# Patient Record
Sex: Female | Born: 1965 | Race: White | Hispanic: No | Marital: Married | Smoking: Never smoker
Health system: Southern US, Community
[De-identification: ages and names within clinical notes are randomized; demographics above are authoritative.]

## PROBLEM LIST (undated history)

## (undated) DIAGNOSIS — Z923 Personal history of irradiation: Secondary | ICD-10-CM

## (undated) DIAGNOSIS — R519 Headache, unspecified: Secondary | ICD-10-CM

## (undated) DIAGNOSIS — C50919 Malignant neoplasm of unspecified site of unspecified female breast: Secondary | ICD-10-CM

## (undated) HISTORY — DX: Headache, unspecified: R51.9

## (undated) HISTORY — DX: Malignant neoplasm of unspecified site of unspecified female breast: C50.919

## (undated) HISTORY — PX: LOBECTOMY: SHX5089

---

## 1997-06-02 ENCOUNTER — Other Ambulatory Visit: Admission: RE | Admit: 1997-06-02 | Discharge: 1997-06-02 | Payer: Self-pay | Admitting: *Deleted

## 1997-07-08 ENCOUNTER — Other Ambulatory Visit: Admission: RE | Admit: 1997-07-08 | Discharge: 1997-07-08 | Payer: Self-pay | Admitting: Obstetrics and Gynecology

## 1997-09-26 ENCOUNTER — Inpatient Hospital Stay (HOSPITAL_COMMUNITY): Admission: AD | Admit: 1997-09-26 | Discharge: 1997-09-29 | Payer: Self-pay | Admitting: Obstetrics and Gynecology

## 1997-10-29 ENCOUNTER — Other Ambulatory Visit: Admission: RE | Admit: 1997-10-29 | Discharge: 1997-10-29 | Payer: Self-pay | Admitting: Obstetrics and Gynecology

## 1998-12-17 ENCOUNTER — Other Ambulatory Visit: Admission: RE | Admit: 1998-12-17 | Discharge: 1998-12-17 | Payer: Self-pay | Admitting: Obstetrics and Gynecology

## 1999-09-02 ENCOUNTER — Other Ambulatory Visit: Admission: RE | Admit: 1999-09-02 | Discharge: 1999-09-02 | Payer: Self-pay | Admitting: Obstetrics and Gynecology

## 2000-03-26 ENCOUNTER — Inpatient Hospital Stay (HOSPITAL_COMMUNITY): Admission: AD | Admit: 2000-03-26 | Discharge: 2000-03-29 | Payer: Self-pay | Admitting: Obstetrics and Gynecology

## 2000-03-30 ENCOUNTER — Encounter: Admission: RE | Admit: 2000-03-30 | Discharge: 2000-04-29 | Payer: Self-pay | Admitting: Obstetrics and Gynecology

## 2000-05-01 ENCOUNTER — Other Ambulatory Visit: Admission: RE | Admit: 2000-05-01 | Discharge: 2000-05-01 | Payer: Self-pay | Admitting: Obstetrics and Gynecology

## 2000-05-30 ENCOUNTER — Encounter: Admission: RE | Admit: 2000-05-30 | Discharge: 2000-06-11 | Payer: Self-pay | Admitting: Obstetrics and Gynecology

## 2001-06-24 ENCOUNTER — Other Ambulatory Visit: Admission: RE | Admit: 2001-06-24 | Discharge: 2001-06-24 | Payer: Self-pay | Admitting: Obstetrics and Gynecology

## 2002-08-08 ENCOUNTER — Other Ambulatory Visit: Admission: RE | Admit: 2002-08-08 | Discharge: 2002-08-08 | Payer: Self-pay | Admitting: Obstetrics and Gynecology

## 2004-03-28 ENCOUNTER — Encounter: Admission: RE | Admit: 2004-03-28 | Discharge: 2004-03-28 | Payer: Self-pay | Admitting: Internal Medicine

## 2005-10-26 ENCOUNTER — Encounter: Admission: RE | Admit: 2005-10-26 | Discharge: 2005-10-26 | Payer: Self-pay | Admitting: Obstetrics and Gynecology

## 2006-11-01 ENCOUNTER — Encounter: Admission: RE | Admit: 2006-11-01 | Discharge: 2006-11-01 | Payer: Self-pay | Admitting: Obstetrics and Gynecology

## 2007-11-04 ENCOUNTER — Encounter: Admission: RE | Admit: 2007-11-04 | Discharge: 2007-11-04 | Payer: Self-pay | Admitting: Obstetrics and Gynecology

## 2008-11-11 ENCOUNTER — Encounter: Admission: RE | Admit: 2008-11-11 | Discharge: 2008-11-11 | Payer: Self-pay | Admitting: Obstetrics and Gynecology

## 2009-11-12 ENCOUNTER — Encounter: Admission: RE | Admit: 2009-11-12 | Discharge: 2009-11-12 | Payer: Self-pay | Admitting: Obstetrics and Gynecology

## 2009-11-25 ENCOUNTER — Encounter: Admission: RE | Admit: 2009-11-25 | Discharge: 2009-11-25 | Payer: Self-pay | Admitting: Obstetrics and Gynecology

## 2010-01-29 ENCOUNTER — Encounter: Payer: Self-pay | Admitting: Obstetrics and Gynecology

## 2010-04-26 ENCOUNTER — Other Ambulatory Visit: Payer: Self-pay | Admitting: Obstetrics and Gynecology

## 2010-04-26 DIAGNOSIS — Z09 Encounter for follow-up examination after completed treatment for conditions other than malignant neoplasm: Secondary | ICD-10-CM

## 2010-05-13 ENCOUNTER — Ambulatory Visit
Admission: RE | Admit: 2010-05-13 | Discharge: 2010-05-13 | Disposition: A | Discharge status: Home / Self Care | Payer: BC Managed Care – PPO | Source: Ambulatory Visit | Attending: Obstetrics and Gynecology | Admitting: Obstetrics and Gynecology

## 2010-05-13 ENCOUNTER — Other Ambulatory Visit: Payer: Self-pay | Admitting: Obstetrics and Gynecology

## 2010-05-13 ENCOUNTER — Other Ambulatory Visit: Payer: Self-pay | Admitting: Diagnostic Radiology

## 2010-05-13 DIAGNOSIS — Z09 Encounter for follow-up examination after completed treatment for conditions other than malignant neoplasm: Secondary | ICD-10-CM

## 2010-05-16 ENCOUNTER — Other Ambulatory Visit: Payer: Self-pay | Admitting: Obstetrics and Gynecology

## 2010-05-16 DIAGNOSIS — C50911 Malignant neoplasm of unspecified site of right female breast: Secondary | ICD-10-CM

## 2010-05-19 ENCOUNTER — Ambulatory Visit
Admission: RE | Admit: 2010-05-19 | Discharge: 2010-05-19 | Disposition: A | Discharge status: Home / Self Care | Payer: BC Managed Care – PPO | Source: Ambulatory Visit | Attending: Obstetrics and Gynecology | Admitting: Obstetrics and Gynecology

## 2010-05-19 ENCOUNTER — Other Ambulatory Visit: Payer: Self-pay | Admitting: Obstetrics and Gynecology

## 2010-05-19 DIAGNOSIS — C50911 Malignant neoplasm of unspecified site of right female breast: Secondary | ICD-10-CM

## 2010-05-19 MED ORDER — GADOBENATE DIMEGLUMINE 529 MG/ML IV SOLN
12.0000 mL | Freq: Once | INTRAVENOUS | Status: AC | PRN
  Administered 2010-05-19: 12 mL via INTRAVENOUS

## 2010-05-20 ENCOUNTER — Ambulatory Visit
Admission: RE | Admit: 2010-05-20 | Discharge: 2010-05-20 | Disposition: A | Discharge status: Home / Self Care | Payer: BC Managed Care – PPO | Source: Ambulatory Visit | Attending: Obstetrics and Gynecology | Admitting: Obstetrics and Gynecology

## 2010-05-20 ENCOUNTER — Other Ambulatory Visit: Payer: Self-pay | Admitting: Diagnostic Radiology

## 2010-05-20 ENCOUNTER — Other Ambulatory Visit: Payer: Self-pay | Admitting: Obstetrics and Gynecology

## 2010-05-20 DIAGNOSIS — C50911 Malignant neoplasm of unspecified site of right female breast: Secondary | ICD-10-CM

## 2010-05-21 ENCOUNTER — Other Ambulatory Visit: Payer: BC Managed Care – PPO

## 2010-05-27 NOTE — H&P (Signed)
[facility]  Patient:    [name] [name], [name] [name]                  MRN: [id number] Adm. Date:  03/26/00 Attending:  Nena Jordan A. Cherly Hensen, M.D. Dictator:   YSheronette A. Cherly Hensen, M.D.                         History and Physical  CHIEF COMPLAINT:              Previous cesarean section, scheduled repeat C section.  HISTORY OF PRESENT ILLNESS:   This is a 45 year old gravida 2 para 1-0-0-1 married white female, last menstrual period of June 13, 1999, [facility] of March 30, 2000, with a prior cesarean section secondary for nonreassuring fetal status, who is now at term being admitted for an elective repeat cesarean section. The patient declined attempted vaginal delivery.  Her prenatal course has been unremarkable.  Prenatal care is at [facility]; primary obstetrician Maxie Better, M.D.  Blood type is O negative, antibody screen negative, RhoGAM was given on January 13, 2000.  RPR nonreactive.  Rubella immune. Hepatitis B surface antigen negative.  HIV testing was nonreactive.  Pap was normal.  GC and chlamydia cultures were negative.  Group B strep culture is positive on March 08, 2000.  Anatomic fetal survey was performed on November 15, 1999 and was normal.  One-hour GCT was normal.  PAST MEDICAL HISTORY:  ALLERGIES:                    No known drug allergies.  MEDICINES:                    Prenatal vitamins.  MEDICAL HISTORY:              Negative.  SURGICAL HISTORY:             Cesarean section September 26, 1997. Cryosurgery in 1993.  OBSTETRICAL HISTORY:          Emergent cesarean section September 26, 1997 for nonreassuring fetal pattern.  Patient was term, 6 pound 11 ounce baby.  FAMILY HISTORY:               Father - thyroid dysfunction.  Maternal grandmother died of stroke.  No genital, colon, or ovarian cancer.  SOCIAL HISTORY:               Married, nonsmoker, one child.  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  REVIEW OF SYSTEMS:            Negative.  PHYSICAL EXAMINATION:  GENERAL:                      Well-developed, well-nourished gravid white female in no acute distress.  VITAL SIGNS:                  Blood pressure 106/70, weight 172.2. pounds. Fetal heart rate 150s.  SKIN:                         Shows no lesions.  HEENT:                        Anicteric sclerae, pink conjunctivae. Oropharynx negative.  HEART:  Regular rate and rhythm without murmur.  LUNGS:                        Clear to auscultation.  BREASTS:                      Soft, nontender, no palpable mass.  ABDOMEN:                      Gravid, fundal height 37 cm.  Fetal heart rate 150.  A low transverse well-healed incision noted.  PELVIC:                       Vulva show no lesion, no palpable mass. Bimanual examination reveals cervix is closed, long, posterior, -2, vertex presentation.  EXTREMITIES:                  Trace edema.  IMPRESSION:                   Term gestation, previous cesarean section, for elective repeat.  Rh negative, group B strep positive.  PLAN:                         Admission, repeat cesarean section.  Routine preoperative labs.  RhoGAM postpartum if indicated.  Risks of the procedure were reviewed, including but not limited to, infection, bleeding, injury to surrounding organ structure, repeat cesarean section in the future, internal scar tissue, indication and risk for blood transfusion.  Postoperative care was also reviewed.  All questions answered.DD:  03/22/00 TD:  03/22/00 Job: [id number]

## 2010-05-27 NOTE — Discharge Summary (Signed)
[facility]  Patient:    [name] [name], [name] [name]                MRN: [id number] Adm. Date:  [id number] Disc. Date: [id number] Attending:  Maxie Better                           Discharge Summary  ADMISSION DIAGNOSES:          Term gestation, previous cesarean section.  DISCHARGE DIAGNOSES:          Term gestation delivered, repeat cesarean section, gestational thrombocytopenia.  PROCEDURE:                    Repeat cesarean section.  HISTORY OF PRESENT ILLNESS:   This is a 45 year old gravida 2, para 1-0-0-1 female at term with a previous cesarean section who is being admitted for a repeat cesarean section.  Her prenatal course was unremarkable.  Blood type is O-.  Rubella is immune.  Group B strep culture is positive.  HOSPITAL COURSE:              The patient was admitted.  She was taken to the operating room where she underwent a repeat cesarean section with subsequent delivery of a live female infant who weighed 8 pounds 4 ounces, Apgars of 8 and 9, cord around the neck x 2.  Normal tubes and ovaries were identified.  Her postoperative course was notable for transient urinary retention.  Her CBC on postoperative day #1 showed a platelet count of 112,000 (her admission platelet count was 146,000), hematocrit 30.9, hemoglobin 10.4.  The babys blood type was subsequently Rh negative, therefore the patient did not receive RhoGAM.  By postoperative day #3 the patient was tolerating a regular diet, had remained afebrile throughout her course and was deemed well to be discharged home.  DISPOSITION:                  Home.  CONDITION ON DISCHARGE:       Stable.  DISCHARGE MEDICATIONS:        1. Tylox #30 one p.o. q.4h. p.r.n. pain.                               2. Prenatal vitamins one p.o. q.d.                               3. Motrin 800 mg one q.6h. p.r.n. pain.  FOLLOW-UP:                    Four weeks postpartum at [facility] at which  time a platelet count will be rechecked. DD:  05/03/00 TD:  05/04/00 Job: [id number]

## 2010-05-27 NOTE — Op Note (Signed)
[facility]  Patient:    [name] [name], [name] [name]                   MRN: [id number] Proc. Date: 03/26/00 Attending:  Nena Jordan A. Cherly Hensen, M.D.                           Operative Report  PREOPERATIVE DIAGNOSES:       1. Term gestation.                               2. Previous cesarean section.  POSTOPERATIVE DIAGNOSIS:      1. Previous cesarean section.                               2. Term gestation.  PROCEDURE:                    Repeat cesarean section per hysterotomy.  SURGEON:                      Sheronette A. Cherly Hensen, M.D.  ASSISTANT:                    Lenoard Aden, M.D.  ANESTHESIA:                   Spinal.  INDICATIONS:                  This is as 45 year old gravida 2, para 1 female at term with a previous cesarean section who now desires an elective repeat cesarean section.  The risks and benefits of the procedure have been ex[plained to the patient and consent was signed.  The patient was transferred to the operating room.  DESCRIPTION OF PROCEDURE:     Under adequate spinal anesthesia, the patient was placed in the supine position with a left lateral tilt.  The abdomen was sterilely prepped and draped in the usual fashion.  An indwelling Foley catheter had been placed sterilely.  Then 0.25% Marcaine was injected along the previous skin incision.  The Pfannenstiel skin incision was then made and carried down to the rectus fascia using Bovie cautery.  The rectus fascia was incised in the midline and extended bilaterally.  The rectus fascia was then bluntly and with sharp dissection dissected off the rectus muscles in a superior and inferior fashion.  During dissection of the superior aspect, the parietal was entered, but no underlying entry was noted.  The rectus muscle was split in the midline.  The parietal peritoneum was further opened and extended.  The vesicouterine peritoneum incision was then created.  The bladder was  then bluntly dissected off the lower uterine segment and displaced from the operative field using a Doyen retractor.  A curvilinear transverse uterine incision was then made and extended bilaterally using bandage scissors.  The amniotic bag was then noted, which was artificially ruptured with a copious amount of clear amniotic fluid.  Subsequent delivery of a live female infant from the right occiput transverse position was then accomplished. a cord around the neck x 2 was noted and reduced easily.  The baby was bulb suctioned on the abdomen.  The cord was then clamped and cut.  The baby was transferred to the awaiting pediatricians, who subsequently assigned Apgars of 8 and 9  at one and five minutes.  The weight of the baby was 8 lb 4 oz.  The placenta was posterior, spontaneous and intact.  The uterus was exteriorized. The uterine cavity was cleaned of debris.  Normal tubes and ovaries were noted bilaterally.  The uterine incision was closed in two layers.  The first layer was a running lock stitch of 0 Monocryl.  The second layer was imbricated using 0 Monocryl suture.  Inspection of the tubes and ovaries was notable for the left mesovarium tissue containing a defect, which was then closed using 3-0 Vicryl sutures x 2.  The uterus was then returned to the abdomen.  The abdomen was copiously irrigated and suctioned of debris.  Reinspection of the incision site as well as the ovaries showed good hemostasis.  The rectus fascia was inspected.  The underlying surface of the superior aspect of the rectus fascia had a small, button-sized defect which was closed with 0 Vicryl figure-of-eight suture.  The rectus muscle was inspected.  No bleeders were noted.  The rectus fascia was closed with a 0 Vicryl x 2.  The subcutaneous area was irrigated.  Small bleeders were cauterized.  The skin was approximated using Ethicon staples.  SPECIMEN:                     Placenta sent to pathology.  ESTIMATED  BLOOD LOSS:         750 cc.  COMPLICATIONS:                None.  INTRAOPERATIVE FLUID:         2100 cc of crystalloid.  URINE OUTPUT:                 250 cc clear yellow urine.  COUNTS:                       Sponge and instrument counts x 2 were correct.  DISPOSITION:                  The patient tolerated the procedure well and was transferred to the recovery room in stable condition. DD:  03/26/00 TD:  03/27/00 Job: [id number]

## 2010-05-30 ENCOUNTER — Encounter (HOSPITAL_BASED_OUTPATIENT_CLINIC_OR_DEPARTMENT_OTHER): Payer: BC Managed Care – PPO | Admitting: Genetic Counselor

## 2010-06-13 ENCOUNTER — Other Ambulatory Visit (INDEPENDENT_AMBULATORY_CARE_PROVIDER_SITE_OTHER): Payer: Self-pay | Admitting: General Surgery

## 2010-06-13 DIAGNOSIS — C50911 Malignant neoplasm of unspecified site of right female breast: Secondary | ICD-10-CM

## 2010-06-14 HISTORY — PX: BREAST LUMPECTOMY: SHX2

## 2010-06-17 ENCOUNTER — Encounter (HOSPITAL_COMMUNITY)
Admission: RE | Admit: 2010-06-17 | Discharge: 2010-06-17 | Disposition: A | Discharge status: Home / Self Care | Payer: BC Managed Care – PPO | Source: Ambulatory Visit | Attending: General Surgery | Admitting: General Surgery

## 2010-06-17 LAB — CANCER ANTIGEN 27.29: CA 27.29: 22 U/mL (ref 0–39)

## 2010-06-17 LAB — DIFFERENTIAL
Lymphocytes Relative: 25 % (ref 12–46)
Lymphs Abs: 1.7 10*3/uL (ref 0.7–4.0)
Monocytes Relative: 5 % (ref 3–12)
Neutro Abs: 4.7 10*3/uL (ref 1.7–7.7)
Neutrophils Relative %: 68 % (ref 43–77)

## 2010-06-17 LAB — CBC
HCT: 40.2 % (ref 36.0–46.0)
Hemoglobin: 13.9 g/dL (ref 12.0–15.0)
MCH: 30.4 pg (ref 26.0–34.0)
MCV: 88 fL (ref 78.0–100.0)
RBC: 4.57 MIL/uL (ref 3.87–5.11)
WBC: 6.9 10*3/uL (ref 4.0–10.5)

## 2010-06-17 LAB — BASIC METABOLIC PANEL
BUN: 12 mg/dL (ref 6–23)
CO2: 28 mEq/L (ref 19–32)
Chloride: 99 mEq/L (ref 96–112)
Creatinine, Ser: 0.53 mg/dL (ref 0.4–1.2)
GFR calc Af Amer: >60 mL/min (ref >60)
Potassium: 4.3 mEq/L (ref 3.5–5.1)

## 2010-06-24 ENCOUNTER — Ambulatory Visit
Admission: RE | Admit: 2010-06-24 | Discharge: 2010-06-24 | Disposition: A | Discharge status: Home / Self Care | Payer: BC Managed Care – PPO | Source: Ambulatory Visit | Attending: General Surgery | Admitting: General Surgery

## 2010-06-24 ENCOUNTER — Ambulatory Visit (HOSPITAL_COMMUNITY)
Admission: RE | Admit: 2010-06-24 | Discharge: 2010-06-24 | Disposition: A | Discharge status: Home / Self Care | Payer: BC Managed Care – PPO | Source: Ambulatory Visit | Attending: General Surgery | Admitting: General Surgery

## 2010-06-24 ENCOUNTER — Other Ambulatory Visit (INDEPENDENT_AMBULATORY_CARE_PROVIDER_SITE_OTHER): Payer: Self-pay | Admitting: General Surgery

## 2010-06-24 DIAGNOSIS — C50911 Malignant neoplasm of unspecified site of right female breast: Secondary | ICD-10-CM

## 2010-06-24 DIAGNOSIS — D0590 Unspecified type of carcinoma in situ of unspecified breast: Secondary | ICD-10-CM | POA: Insufficient documentation

## 2010-06-24 DIAGNOSIS — Z01812 Encounter for preprocedural laboratory examination: Secondary | ICD-10-CM | POA: Insufficient documentation

## 2010-06-27 NOTE — Op Note (Signed)
[name], [name]         ACCOUNT NO.:  [id number]  MEDICAL RECORD NO.:  [id number]  LOCATION:  [facility]                         FACILITY:  [facility]  PHYSICIAN:  Juanetta Gosling, MDDATE OF BIRTH:  1965/02/27  DATE OF PROCEDURE:  06/24/2010 DATE OF DISCHARGE:  06/24/2010                              OPERATIVE REPORT   PREOPERATIVE DIAGNOSIS:  Stage 0 right breast cancer.  POSTOPERATIVE DIAGNOSIS:  Stage 0 right breast cancer.  PROCEDURE:  Right breast wire guided lumpectomy.  SURGEON:  Juanetta Gosling, MD  ASSISTANT:  None.  ANESTHESIA:  General.  SUPERVISING ANESTHESIOLOGIST:  Dr. Adonis Huguenin.  SPECIMENS:  Right breast tissue marked with paint kit to pathology.  ESTIMATED BLOOD LOSS:  Minimal.  COMPLICATIONS:  None.  DRAINS:  None.  DISPOSITION:  To recovery room in stable condition.  INDICATIONS:  This is a 45 year old healthy female who was undergoing a short interval follow-up for some right breast microcalcifications in early May where she had a cluster of slightly pleomorphic calcifications in the right upper inner quadrant.  She underwent a stereotactic core biopsy with tissue marker clip placement showing DCIS with calcifications and ALH.  ER and PR was both positive.  She also underwent diagnostic left mammogram showing some suspicious calcifications that were biopsied and were negative and now recommended for any further evaluation at this time.  MRI showed postbiopsy changes in the medial right breast associated with marker artifact, but no other abnormalities.  We discussed all of her different options in that she had negative genetic testing we decided after all discussing all these options to proceed with breast conservation therapy.  We are not going to pursue a lymph node biopsy at this time given the characteristics of this size and as well as the abnormality of any other findings.  PROCEDURE:  After informed consent was obtained, the  patient was taken to the operating room.  She had first had a wire placed by Dr. Deboraha Sprang and I had the mammograms available for my review in the operating room.  She was administered 1 g of intravenous cefazolin.  Sequential compression devices were placed on lower extremities prior to induction with anesthesia.  She was then placed under general anesthesia with an LMA without complication.  Her right breast was prepped and draped in standard sterile surgical fashion.  A surgical time-out was then performed.  I then injected Exparel all around the site of the surgery.  I then made a radial incision overlying the wire and tracked towards the areolar border.  Cautery was then used to remove the wire as well as the lesion and the surrounding tissue.  I did not take this all the way down to the pectoralis fascia given the amount of breast tissue that had to be removed to do that.  I took a wide margin around the wire.  Hemostasis was observed.  I then marked this with a paint kit.  I then used the Flaxitron mammogram, the clip and the lesions appeared to be in the center of the area that I had removed.  This was then sent off to pathology.  This was also confirmed by Dr. Deboraha Sprang.  I placed two clips deep and one  clip in each cardinal position in the cavity.  I then closed the deep breast tissue with 2-0 Vicryl, the dermis with 3-0 Monocryl, and the Dermabond and Steri-Strips were placed over this.  An ABD and a breast binder were then placed.  She tolerated this well, was extubated in the operating room, and transferred to recovery room in stable condition.     Juanetta Gosling, MD     MCW/MEDQ  D:  06/24/2010  T:  06/25/2010  Job:  [id number]  cc:   Luanna Cole. Lenord Fellers, M.D. Daryl Eastern, M.D.  Electronically Signed by Emelia Loron MD on 06/27/2010 04:21:56 PM

## 2010-06-29 ENCOUNTER — Encounter (HOSPITAL_BASED_OUTPATIENT_CLINIC_OR_DEPARTMENT_OTHER): Payer: BC Managed Care – PPO | Admitting: Oncology

## 2010-06-29 ENCOUNTER — Encounter (INDEPENDENT_AMBULATORY_CARE_PROVIDER_SITE_OTHER): Payer: Self-pay | Admitting: General Surgery

## 2010-06-29 ENCOUNTER — Other Ambulatory Visit: Payer: Self-pay | Admitting: Oncology

## 2010-06-29 ENCOUNTER — Encounter: Payer: BC Managed Care – PPO | Admitting: Oncology

## 2010-06-29 DIAGNOSIS — D0590 Unspecified type of carcinoma in situ of unspecified breast: Secondary | ICD-10-CM

## 2010-06-29 LAB — CBC WITH DIFFERENTIAL/PLATELET
Basophils Absolute: 0 10*3/uL (ref 0.0–0.1)
Eosinophils Absolute: 0.1 10*3/uL (ref 0.0–0.5)
HCT: 38.3 % (ref 34.8–46.6)
HGB: 13 g/dL (ref 11.6–15.9)
NEUT#: 3.3 10*3/uL (ref 1.5–6.5)
NEUT%: 61.4 % (ref 38.4–76.8)
RDW: 13 % (ref 11.2–14.5)
lymph#: 1.6 10*3/uL (ref 0.9–3.3)

## 2010-06-29 LAB — COMPREHENSIVE METABOLIC PANEL
Albumin: 4 g/dL (ref 3.5–5.2)
BUN: 13 mg/dL (ref 6–23)
CO2: 27 mEq/L (ref 19–32)
Calcium: 9.5 mg/dL (ref 8.4–10.5)
Chloride: 101 mEq/L (ref 96–112)
Creatinine, Ser: 0.58 mg/dL (ref 0.50–1.10)
Glucose, Bld: 87 mg/dL (ref 70–99)
Potassium: 3.7 mEq/L (ref 3.5–5.3)

## 2010-06-30 ENCOUNTER — Ambulatory Visit
Admission: RE | Admit: 2010-06-30 | Discharge: 2010-06-30 | Disposition: A | Discharge status: Home / Self Care | Payer: BC Managed Care – PPO | Source: Ambulatory Visit | Attending: Radiation Oncology | Admitting: Radiation Oncology

## 2010-06-30 DIAGNOSIS — Z510 Encounter for antineoplastic radiation therapy: Secondary | ICD-10-CM | POA: Insufficient documentation

## 2010-06-30 DIAGNOSIS — D0590 Unspecified type of carcinoma in situ of unspecified breast: Secondary | ICD-10-CM | POA: Insufficient documentation

## 2010-07-12 ENCOUNTER — Encounter (INDEPENDENT_AMBULATORY_CARE_PROVIDER_SITE_OTHER): Payer: BC Managed Care – PPO | Admitting: General Surgery

## 2010-07-18 ENCOUNTER — Encounter (INDEPENDENT_AMBULATORY_CARE_PROVIDER_SITE_OTHER): Payer: Self-pay | Admitting: General Surgery

## 2010-07-18 ENCOUNTER — Ambulatory Visit (INDEPENDENT_AMBULATORY_CARE_PROVIDER_SITE_OTHER): Payer: BC Managed Care – PPO | Admitting: General Surgery

## 2010-07-18 DIAGNOSIS — Z09 Encounter for follow-up examination after completed treatment for conditions other than malignant neoplasm: Secondary | ICD-10-CM

## 2010-07-18 DIAGNOSIS — C50811 Malignant neoplasm of overlapping sites of right female breast: Secondary | ICD-10-CM | POA: Insufficient documentation

## 2010-07-18 DIAGNOSIS — D0590 Unspecified type of carcinoma in situ of unspecified breast: Secondary | ICD-10-CM

## 2010-07-18 NOTE — Progress Notes (Signed)
Subjective:     Patient ID: [name] [name], female   DOB: 1965-11-01, 45 y.o.   MRN: [id number]    LMP 05/10/2010    HPI This is a 45 year old female who had a newly diagnosed right breast mass. She underwent a right breast wire-guided lumpectomy on June 15. 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 All of her margins are clear. The closest margin is her superior anterior margin which is 2 mm. This is ER positive at 98%. This is PR positive at 98%. She has done fairly well after this. She had a little bit of drainage from her incision. For which she was seen by one of my partners. She also had a little bit of drainage when she was at the beach recently was placed on Levaquin. While she was at the beach he also a fairly significant episode where she had some skin issues with blistering on her arms legs and on her face. This is all getting better and predated her antibiotics. She comes in today doing fairly well without any significant complaints. She's been seen by Dr. Lurline Hare and is due to get stimulated tomorrow to begin radiation therapy on Monday. She's also been seen by Dr. Marikay Alar Magrinat and  has a prescription for tamoxifen which she'll begin.  Review of Systems     Objective:   Physical Exam Right breast incision healing without infection, small area of skin separation, there is redness clearly where she has had a tape dressing in place    Assessment:      Stage 0 right breast cancer Postop lumpectomy    Plan:         I discussed with the patient and her husband today think this area she does continue to improve. I told him to bleeding or tape on the incision. I told her she could begin massaging her incision with cocoa butter. She can also put Neosporin over it. I think he should be fine to begin radiation next week I will lead Dr. Michell Heinrich no this. We discussed followup over the long-term. She is due to begin  tamoxifen after she completes radiation. I plan on seeing her back about a month or so after she completes radiation just to make sure everything is healed the she's doing well. Following that I would be happy to switch visits with Dr. 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

## 2010-09-27 ENCOUNTER — Ambulatory Visit (INDEPENDENT_AMBULATORY_CARE_PROVIDER_SITE_OTHER): Payer: BC Managed Care – PPO | Admitting: General Surgery

## 2010-09-27 ENCOUNTER — Encounter (INDEPENDENT_AMBULATORY_CARE_PROVIDER_SITE_OTHER): Payer: Self-pay | Admitting: General Surgery

## 2010-09-27 VITALS — BP 102/74 | HR 68 | Temp 97.6°F | Resp 14 | Ht 64.0 in | Wt 139.6 lb

## 2010-09-27 DIAGNOSIS — D0590 Unspecified type of carcinoma in situ of unspecified breast: Secondary | ICD-10-CM

## 2010-09-27 NOTE — Patient Instructions (Signed)
Breast Problems and Self Exam Completing monthly breast exams may pick up problems early and save lives. There can be numerous causes of swelling, tenderness or lumps in the breasts. Some of these causes are:   Fibrocystic breast syndrome (noncancerous lumps). This is the most common cause of lumps in the breast.   Fibroadenoma breast tumors of unknown cause. These are noncancerous (benign) lumps.   Benign fatty tumors (lipomas).   Cancer of the breast.  By doing monthly breast exams, you get to know how your breasts feel and how they can change from month to month. This allows you to notice changes early. It can also offer you some reassurance that your breast health is good.  BREAST SELF EXAM There are a few points to follow when doing a thorough breast exam. The best time to examine your breasts is 5 to 7 days after your menstrual period is over. During menstruation, the breasts are lumpier, and it may be more difficult to pick up changes. If you do not menstruate, have reached menopause, or had a hysterectomy (uterus removal), examine your breasts the first day of every month. After 3 to 4 months, you will become more familiar with the variations of your breasts and more comfortable with the exam.  Perform your breast exam monthly. Keep a written record with breast changes or normal findings for each breast. This makes it easier to be sure of changes, so you do not need to depend only on memory for size, tenderness, or location. Try to do the exam at the same time each month, and write down where you are in your menstrual cycle, if you are still menstruating.   Look at your breasts. Stand in front of a mirror with your hands clasped behind your head. Tighten your chest muscles and look for asymmetry. This means a difference in shape or contour from one breast to the other, such as puckers, dips or bumps. Also, look for skin changes.    Lean forward with your hands on your hips. Again, look for  symmetry and skin changes.   While showering, soap the breasts. Then, carefully feel the breasts with your fingertips, while holding the other arm (on the side of the breast you are examining) over your head. Do this with each breast, carefully feeling for lumps or changes. Typically, a circular motion with moderate fingertip pressure should be used.    Repeat this exam while lying on your back. Put your arm behind your head and a pillow under your shoulders. Again, use your fingertips to examine both breasts, feeling for lumps and thickening. Begin at the top of your breast, and go clockwise around the whole breast.   At the end of your exam, gently squeeze each nipple to see if there is any drainage of fluids. Look for nipple changes, dimpling, or redness.   Lastly, examine the upper chest and collarbone (clavicle) areas, and in your armpits.  It is not necessary to be alarmed if you find a breast lump. Most of them are not cancerous. However, it is necessary to see your caregiver if a lump is found, in order to have it looked at. Document Released: 12/26/2004 Document Re-Released: 01/17/2009 [employer] Patient Information 2011 [employer]. 

## 2010-09-27 NOTE — Progress Notes (Signed)
Subjective:     Patient ID: [name] [name], female   DOB: Jul 16, 1965, 45 y.o.   MRN: [id number]  HPI  This is a 45 year old female who underwent a right breast lumpectomy for a stage 0 right breast cancer. Following this she underwent radiation therapy with Dr. Lurline Hare which she completed on August 31. She began her tamoxifen last Monday per Dr. Darnelle Catalan and she is tolerating this well at this point. She comes back in today for exam after radiation and a final check. She is doing well without any real significant complaints. The right breast is somewhat smaller than the left after therapy but she otherwise has no complaints.  Review of Systems     Objective:   Physical Exam  Constitutional: She appears well-developed and well-nourished.  Pulmonary/Chest: Right breast exhibits no inverted nipple, no mass, no nipple discharge, no skin change and no tenderness. Left breast exhibits no inverted nipple, no mass, no nipple discharge, no skin change and no tenderness. Breasts are symmetrical.    Lymphadenopathy:    She has no cervical adenopathy.    She has no axillary adenopathy.       Assessment:     Stage 0 right breast cancer s/p lumpectomy, xrt now on tamoxifen    Plan:        She is doing very well after breast conservation therapy combined with radiation therapy. She is tolerating her anti-estrogen therapy is going to continue on that. She is going to call the followed by Dr. Darnelle Catalan none in 6 months. I will be happy to see her in a year and switch visits with Dr. Darnelle Catalan at that point. She is due to get her mammogram in November. I asked her to call me if she has any questions or problems before her next visit.

## 2010-10-13 ENCOUNTER — Other Ambulatory Visit (INDEPENDENT_AMBULATORY_CARE_PROVIDER_SITE_OTHER): Payer: Self-pay | Admitting: General Surgery

## 2010-10-13 DIAGNOSIS — Z9889 Other specified postprocedural states: Secondary | ICD-10-CM

## 2010-10-25 ENCOUNTER — Ambulatory Visit
Admission: RE | Admit: 2010-10-25 | Discharge: 2010-10-25 | Disposition: A | Discharge status: Home / Self Care | Payer: BC Managed Care – PPO | Source: Ambulatory Visit | Attending: Radiation Oncology | Admitting: Radiation Oncology

## 2010-11-07 ENCOUNTER — Encounter (HOSPITAL_BASED_OUTPATIENT_CLINIC_OR_DEPARTMENT_OTHER): Payer: BC Managed Care – PPO | Admitting: Oncology

## 2010-11-07 DIAGNOSIS — C50219 Malignant neoplasm of upper-inner quadrant of unspecified female breast: Secondary | ICD-10-CM

## 2010-11-07 DIAGNOSIS — D0590 Unspecified type of carcinoma in situ of unspecified breast: Secondary | ICD-10-CM

## 2010-11-14 ENCOUNTER — Ambulatory Visit
Admission: RE | Admit: 2010-11-14 | Discharge: 2010-11-14 | Disposition: A | Discharge status: Home / Self Care | Payer: BC Managed Care – PPO | Source: Ambulatory Visit | Attending: General Surgery | Admitting: General Surgery

## 2010-11-14 DIAGNOSIS — Z9889 Other specified postprocedural states: Secondary | ICD-10-CM

## 2011-06-06 ENCOUNTER — Other Ambulatory Visit: Payer: BC Managed Care – PPO | Admitting: Lab

## 2011-06-06 ENCOUNTER — Ambulatory Visit: Payer: BC Managed Care – PPO | Admitting: Oncology

## 2011-06-14 ENCOUNTER — Telehealth: Payer: Self-pay | Admitting: *Deleted

## 2011-06-14 ENCOUNTER — Ambulatory Visit (HOSPITAL_BASED_OUTPATIENT_CLINIC_OR_DEPARTMENT_OTHER): Payer: BC Managed Care – PPO | Admitting: Oncology

## 2011-06-14 ENCOUNTER — Other Ambulatory Visit (HOSPITAL_BASED_OUTPATIENT_CLINIC_OR_DEPARTMENT_OTHER): Payer: BC Managed Care – PPO | Admitting: Lab

## 2011-06-14 VITALS — BP 99/66 | HR 66 | Temp 97.7°F | Ht 64.0 in | Wt 142.5 lb

## 2011-06-14 DIAGNOSIS — Z170 Estrogen receptor positive status [ER+]: Secondary | ICD-10-CM

## 2011-06-14 DIAGNOSIS — C50219 Malignant neoplasm of upper-inner quadrant of unspecified female breast: Secondary | ICD-10-CM

## 2011-06-14 DIAGNOSIS — D0590 Unspecified type of carcinoma in situ of unspecified breast: Secondary | ICD-10-CM

## 2011-06-14 LAB — CBC WITH DIFFERENTIAL/PLATELET
Basophils Absolute: 0 10*3/uL (ref 0.0–0.1)
EOS%: 1.7 % (ref 0.0–7.0)
Eosinophils Absolute: 0.1 10*3/uL (ref 0.0–0.5)
HGB: 13.4 g/dL (ref 11.6–15.9)
MCH: 30.4 pg (ref 25.1–34.0)
MONO#: 0.4 10*3/uL (ref 0.1–0.9)
NEUT#: 3.3 10*3/uL (ref 1.5–6.5)
RDW: 12.5 % (ref 11.2–14.5)
WBC: 5 10*3/uL (ref 3.9–10.3)
lymph#: 1.3 10*3/uL (ref 0.9–3.3)

## 2011-06-14 MED ORDER — TAMOXIFEN CITRATE 20 MG PO TABS: 20.0000 mg | ORAL_TABLET | Freq: Every day | ORAL | Status: DC

## 2011-06-14 NOTE — Progress Notes (Signed)
ID: [name] [name]   DOB: Feb 11, 1965  MR#: [id number]  WUJ#:[id number]  HISTORY OF PRESENT ILLNESS:  [name] [name] had routine screening mammography November 2011 showing some calcifications in the right breast and a possible mass.  Diagnostic mammography November 17 showed normal appearing parenchyma at the location of the possible mass.  There was no change there and no true mass.  There were some loosely clustered faint calcifications in the medial portion of the breast, however.  These were felt likely to be benign and associated with fibrocystic change, but 34-month follow-up was suggested.   This was performed on May 4 when the patient had a diagnostic right mammogram, and this showed additional calcifications which also appeared more pleomorphic than on the prior exam.  Accordingly the patient proceeded directly to biopsy that same day and this showed ([id number]) ductal carcinoma in situ, intermediate grade.  The tumor was 98% estrogen and 98% progesterone receptor positive.   With this information, the patient was referred to Dr. Dwain Sarna and bilateral breast MRI was obtained May 11.  This showed only post biopsy changes in the medial right breast, otherwise really no MR evidence of malignancy in either breast. On May 10 the patient had diagnostic mammogram of the left breast.  This showed some suspicious calcifications in the lower left breast, however, biopsy of this area May 20, 2010 ([id number]) showed only fibrocystic changes with no atypia or malignancy.   Accordingly on June 24, 2010 the patient underwent right wire guided lumpectomy.  The pathology from this procedure ([id number]) showed an intermediate grade ductal carcinoma in situ measuring 1 cm.  All the margins were clear, the closest being 2 mm. Her subsequent history is as detailed below.  INTERVAL HISTORY: [name] [name] returns today for followup of her breast cancer. Interval history is quite unremarkable. She is tolerating tamoxifen  with no side effects it she is aware of. She exercises regularly, mostly walking, elliptical, and tennis  REVIEW OF SYSTEMS: She has noted a little bit of scabbing on her right nipple. This is not constant. She has not correlated it with her periods I., but she will see whether they tend to occur during or after. Her menstrual cycles are a little bit more irregular and briefer than previously, but there still pretty much every month. She is having mild of arthritic discomfort in her right foot and at the base of both thumbs. She does not take any medication for this A detailed review of systems today was otherwise noncontributory  PAST MEDICAL HISTORY: Past Medical History  Diagnosis Date  . Breast cancer     stage 0 breast cancer s/p lumpectomy    PAST SURGICAL HISTORY: Past Surgical History  Procedure Date  . Cesarean section     1999/2002  . Breast lumpectomy     right    FAMILY HISTORY The patient's parents are alive, in their 6's.  She has no brothers.  She has 1 sister, but there is no breast or ovarian or indeed any cancer in the immediate family to her knowledge.  GYNECOLOGIC HISTORY: She is G X, P 2, menarche age 8, first pregnancy to term age 85. She is still menstruating, although perhaps a little bit more irregularly, and her periods are scant. 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  Her husband of 16 years [name] used to be in Associate Professor ([employer]).  Currently is not employed.  They have 2 sons, age 29  and 11.  They attend [name].   ADVANCED DIRECTIVES: in place  HEALTH MAINTENANCE: History  Substance Use Topics  . Smoking status: Never Smoker   . Smokeless tobacco: Not on file  . Alcohol Use: Yes     Colonoscopy:  PAP: UTD/ [name]  Bone density:  Lipid panel:  No Known Allergies  Current Outpatient Prescriptions  Medication Sig Dispense Refill  . tamoxifen  (NOLVADEX) 20 MG tablet Take 1 tablet (20 mg total) by mouth daily.  90 tablet  12    OBJECTIVE: Middle-aged white woman who appears fit Filed Vitals:   06/14/11 1041  BP: 99/66  Pulse: 66  Temp: 97.7 F (36.5 C)     Body mass index is 24.46 kg/(m^2).    ECOG FS: 0  Sclerae unicteric Oropharynx clear No peripheral adenopathy Lungs no rales or rhonchi Heart regular rate and rhythm Abd benign MSK no focal spinal tenderness, no peripheral edema Neuro: nonfocal Breasts: The right breast is status post lumpectomy. There is no evidence of local recurrence. On the tip of the nipple there are 2 tiny cream colored dry scabs, measuring approximately half to 1 mm each. The left breast is unremarkable  LAB RESULTS: Lab Results  Component Value Date   WBC 5.0 06/14/2011   NEUTROABS 3.3 06/14/2011   HGB 13.4 06/14/2011   HCT 39.9 06/14/2011   MCV 90.5 06/14/2011   PLT 164 06/14/2011      Chemistry      Component Value Date/Time   NA 137 06/29/2010 1606   K 3.7 06/29/2010 1606   CL 101 06/29/2010 1606   CO2 27 06/29/2010 1606   BUN 13 06/29/2010 1606   CREATININE 0.58 06/29/2010 1606      Component Value Date/Time   CALCIUM 9.5 06/29/2010 1606   ALKPHOS 65 06/29/2010 1606   AST 19 06/29/2010 1606   ALT 13 06/29/2010 1606   BILITOT 0.3 06/29/2010 1606       Lab Results  Component Value Date   LABCA2 22 06/17/2010    No components found with this basename: [id number]    No results found for this basename: INR:1;PROTIME:1 in the last 168 hours  Urinalysis No results found for this basename: colorurine,  appearanceur,  labspec,  phurine,  glucoseu,  hgbur,  bilirubinur,  ketonesur,  proteinur,  urobilinogen,  nitrite,  leukocytesur    STUDIES: Mammography will be due 2018/12/14  ASSESSMENT: 46 y.o. [name] [name] woman status post right lumpectomy June 2012 for a 1 cm, grade 2 ductal carcinoma in situ, strongly estrogen and progesterone receptor positive, status post radiation completed at  the end of August 2012, with tamoxifen started early September 2012.   PLAN: [name] [name] is doing fine as far as her noninvasive breast cancer is concerned. I've asked her to try to correlate the little bit of nipple discharge that she has with her periods, and that likely will provide [place] with an explanation. Certainly there is no evidence of recurrence and she is in great physical shape. She will see me again in one year. The plan is to continue tamoxifen for total of 5.   Reis Goga C    06/14/2011

## 2011-06-14 NOTE — Telephone Encounter (Signed)
gave patient appointment for 06-13-2012 starting at 8:30am printed out calendar and gave to the patient 

## 2011-06-14 NOTE — Telephone Encounter (Signed)
gave patient appointment for 06-13-2012 starting at 8:30am printed out calendar and gave to the patient

## 2011-07-10 ENCOUNTER — Encounter (INDEPENDENT_AMBULATORY_CARE_PROVIDER_SITE_OTHER): Payer: Self-pay | Admitting: General Surgery

## 2011-07-19 ENCOUNTER — Encounter (INDEPENDENT_AMBULATORY_CARE_PROVIDER_SITE_OTHER): Payer: Self-pay | Admitting: General Surgery

## 2011-10-09 ENCOUNTER — Ambulatory Visit (INDEPENDENT_AMBULATORY_CARE_PROVIDER_SITE_OTHER): Payer: BC Managed Care – PPO | Admitting: General Surgery

## 2011-10-09 ENCOUNTER — Encounter (INDEPENDENT_AMBULATORY_CARE_PROVIDER_SITE_OTHER): Payer: Self-pay | Admitting: General Surgery

## 2011-10-09 VITALS — BP 118/62 | HR 68 | Temp 97.9°F | Resp 16 | Ht 64.5 in | Wt 142.6 lb

## 2011-10-09 DIAGNOSIS — Z853 Personal history of malignant neoplasm of breast: Secondary | ICD-10-CM

## 2011-10-09 NOTE — Progress Notes (Signed)
Subjective:     Patient ID: [name] [name], female   DOB: 05-Aug-1965, 46 y.o.   MRN: [id number]  HPI This is a 46 year old female who in May of 2012 underwent a lumpectomy for ductal carcinoma in situ that was intermediate grade. She had 2 mm margins. She then subsequently underwent radiation therapy which he completed August 12. She is now been on tamoxifen which she is tolerating well.she has no real complaints referable to her breasts at this point all. She's been doing well over the last year. She had some occasional scant bleeding at her right nipple which is now appears to be subsiding. She denies any discharge. There is an area on the left side where she wonders if it is a mass.  Review of Systems     Objective:   Physical Exam  Vitals reviewed. Constitutional: She appears well-developed and well-nourished.  Pulmonary/Chest: Right breast exhibits no inverted nipple, no mass, no nipple discharge, no skin change and no tenderness. Left breast exhibits no inverted nipple, no mass, no nipple discharge, no skin change and no tenderness. Breasts are symmetrical.    Lymphadenopathy:    She has no cervical adenopathy.    She has no axillary adenopathy.       Right: No supraclavicular adenopathy present.       Left: No supraclavicular adenopathy present.       Assessment:     History of stage 0 right breast cancer treated with lumpectomy and radiation therapy now on tamoxifen    Plan:     She has no clinical evidence of recurrence. I do not identify a left breast mass and I think what she was feeling was a rib. I am not really concerned about the area on  the right side as clinically all appears to be normal and is subsiding as well. She is due to get her mammogram in November. She is going to continue her own self exams I will plan to see her back in one year ago she needs something sooner.

## 2011-10-09 NOTE — Patient Instructions (Signed)

## 2011-10-19 ENCOUNTER — Other Ambulatory Visit (INDEPENDENT_AMBULATORY_CARE_PROVIDER_SITE_OTHER): Payer: Self-pay | Admitting: General Surgery

## 2011-10-19 DIAGNOSIS — Z9889 Other specified postprocedural states: Secondary | ICD-10-CM

## 2011-10-19 DIAGNOSIS — Z853 Personal history of malignant neoplasm of breast: Secondary | ICD-10-CM

## 2011-11-16 ENCOUNTER — Ambulatory Visit
Admission: RE | Admit: 2011-11-16 | Discharge: 2011-11-16 | Disposition: A | Discharge status: Home / Self Care | Payer: BC Managed Care – PPO | Source: Ambulatory Visit | Attending: General Surgery | Admitting: General Surgery

## 2011-11-16 DIAGNOSIS — Z853 Personal history of malignant neoplasm of breast: Secondary | ICD-10-CM

## 2011-11-16 DIAGNOSIS — Z9889 Other specified postprocedural states: Secondary | ICD-10-CM

## 2012-01-31 ENCOUNTER — Encounter: Payer: Self-pay | Admitting: *Deleted

## 2012-01-31 ENCOUNTER — Other Ambulatory Visit: Payer: Self-pay | Admitting: Physician Assistant

## 2012-01-31 DIAGNOSIS — R600 Localized edema: Secondary | ICD-10-CM

## 2012-01-31 DIAGNOSIS — D0590 Unspecified type of carcinoma in situ of unspecified breast: Secondary | ICD-10-CM

## 2012-01-31 NOTE — Progress Notes (Signed)
Received call from patient stating she has been having some swelling and pain in her left knee for a few weeks.  She states it doesn't when she walks only when she has been sitting for a period of time and then gets up.  She does travel about every other week by plane.  Per Vikki Ports we will get a doppler of that left lower extremity and if that is negative may need to be referred to orthopedic.  Appt. Made for doppler 02/01/12 at 0900 and patient aware.

## 2012-02-01 ENCOUNTER — Ambulatory Visit (HOSPITAL_COMMUNITY)
Admission: RE | Admit: 2012-02-01 | Discharge: 2012-02-01 | Disposition: A | Discharge status: Home / Self Care | Payer: BC Managed Care – PPO | Source: Ambulatory Visit | Attending: Physician Assistant | Admitting: Physician Assistant

## 2012-02-01 ENCOUNTER — Telehealth: Payer: Self-pay | Admitting: *Deleted

## 2012-02-01 DIAGNOSIS — M7989 Other specified soft tissue disorders: Secondary | ICD-10-CM

## 2012-02-01 DIAGNOSIS — M79609 Pain in unspecified limb: Secondary | ICD-10-CM | POA: Insufficient documentation

## 2012-02-01 DIAGNOSIS — C50919 Malignant neoplasm of unspecified site of unspecified female breast: Secondary | ICD-10-CM | POA: Insufficient documentation

## 2012-02-01 DIAGNOSIS — D0590 Unspecified type of carcinoma in situ of unspecified breast: Secondary | ICD-10-CM

## 2012-02-01 DIAGNOSIS — R609 Edema, unspecified: Secondary | ICD-10-CM | POA: Insufficient documentation

## 2012-02-01 DIAGNOSIS — R600 Localized edema: Secondary | ICD-10-CM

## 2012-02-01 NOTE — Progress Notes (Signed)
VASCULAR LAB PRELIMINARY  PRELIMINARY  PRELIMINARY  PRELIMINARY  Left lower extremity venous duplex completed.    Preliminary report:  Left:  No evidence of DVT, superficial thrombosis, or Baker's cyst.  Azael Ragain, RVS 02/01/2012, 9:25 AM

## 2012-02-01 NOTE — Telephone Encounter (Signed)
Call report that venous doppler is negative for DVT. Reported to Zollie Scale, PA. Instructed patient she may go home.

## 2012-04-01 IMAGING — MG MM BREAST WIRE LOCALIZATION*R*
4 series · 4 of 4 positions shown · non-contrast
Comparison: none

CLINICAL DATA: Ductal carcinoma in situ in the medial aspect of
the right breast.

[R CC (1 of 2)]
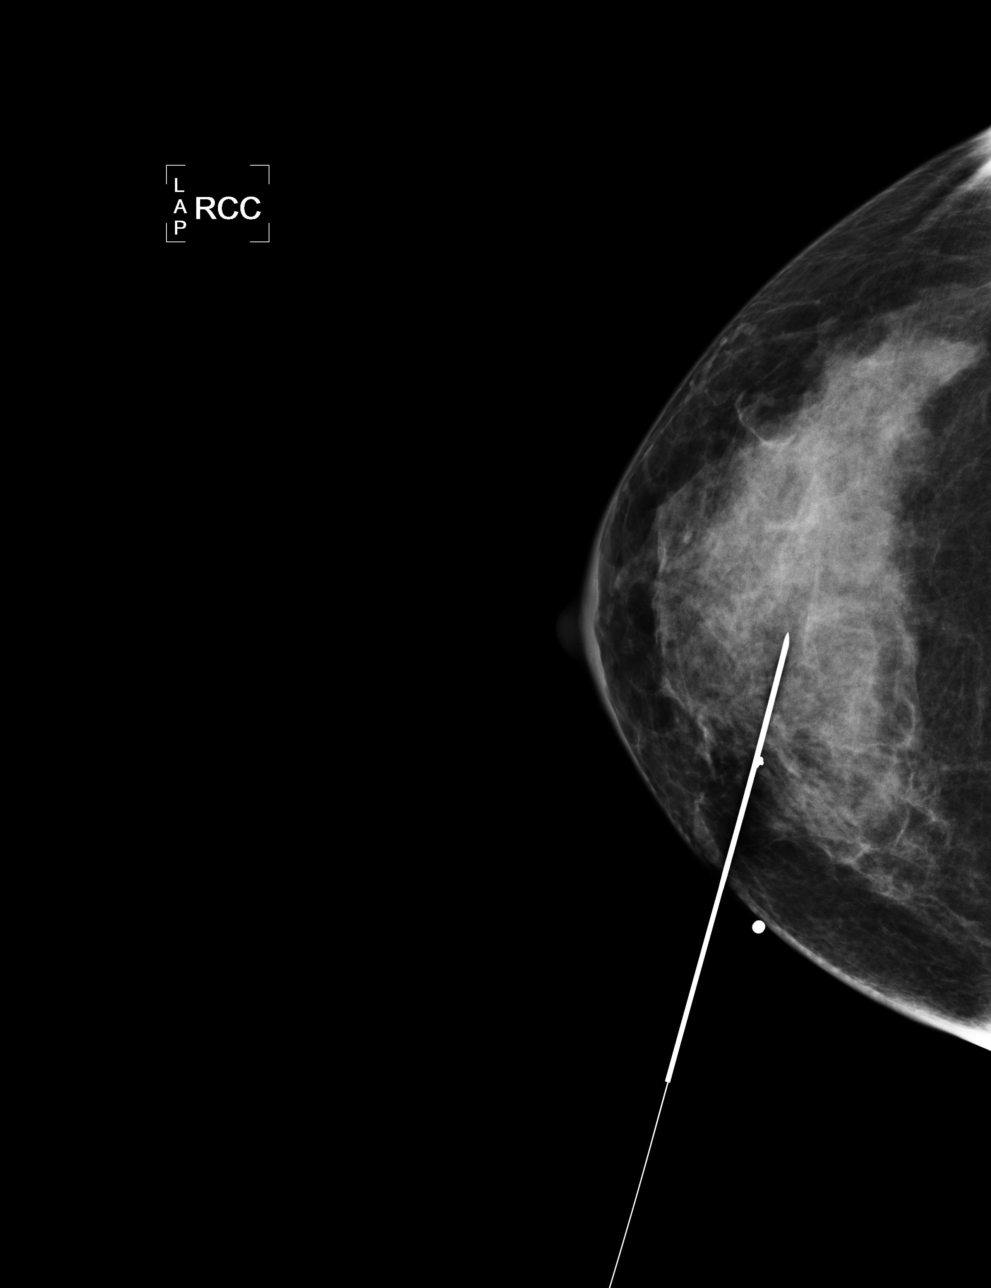

[R ML (1 of 2)]
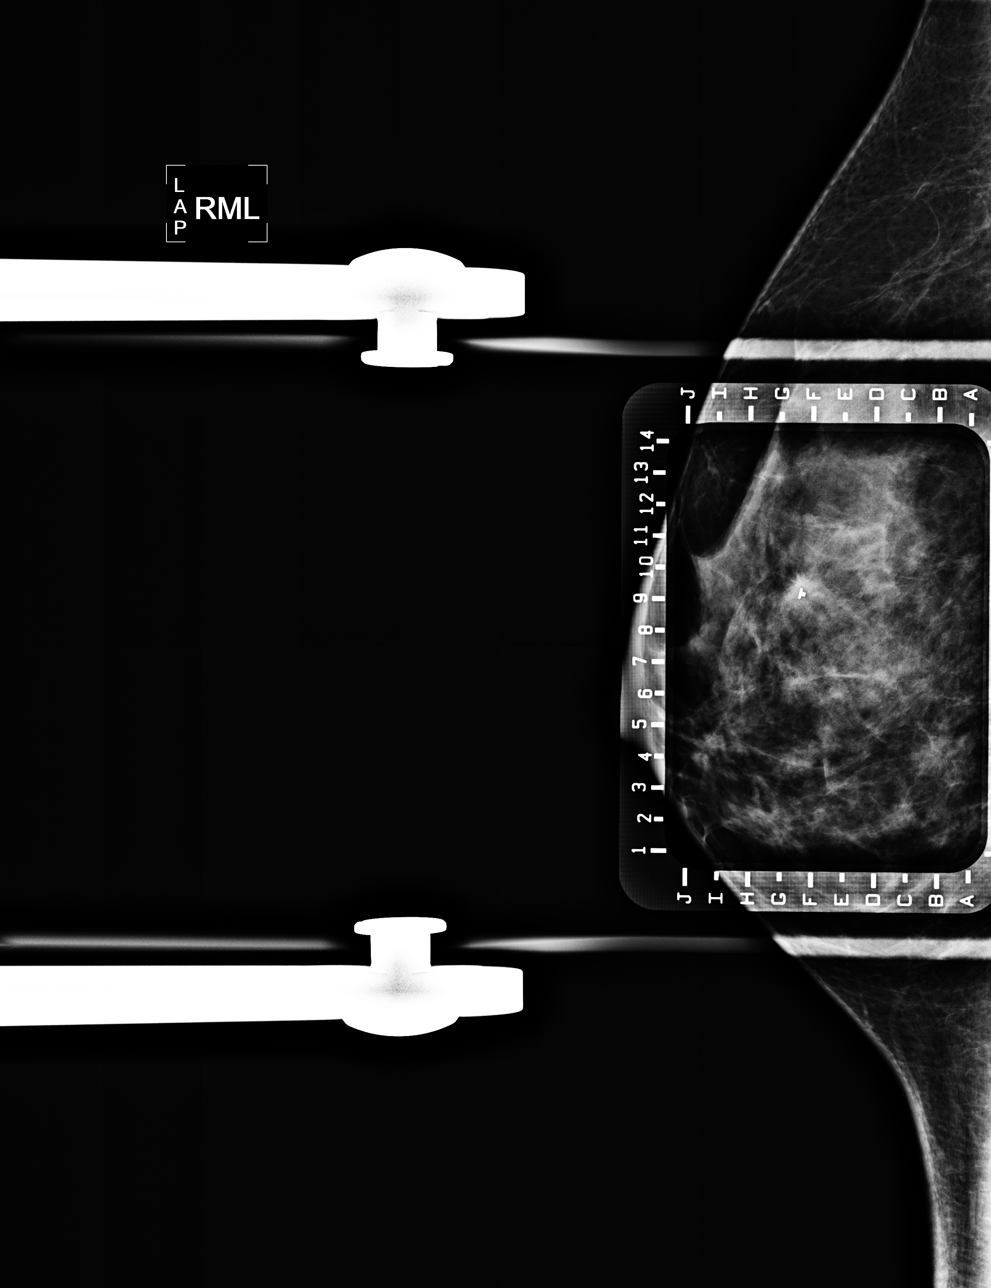

[R CC (2 of 2)]
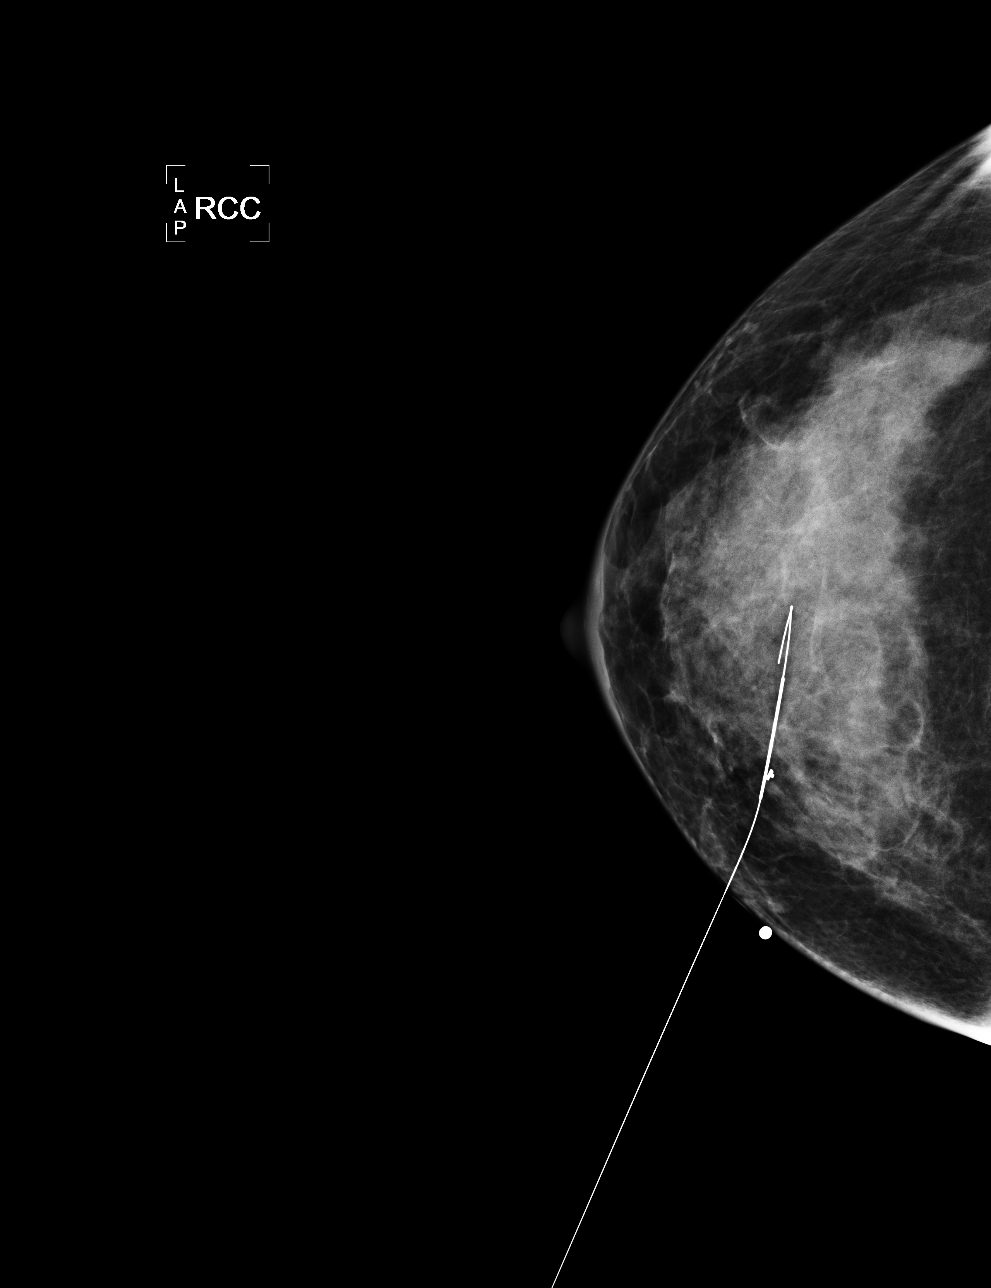

[R ML (2 of 2)]
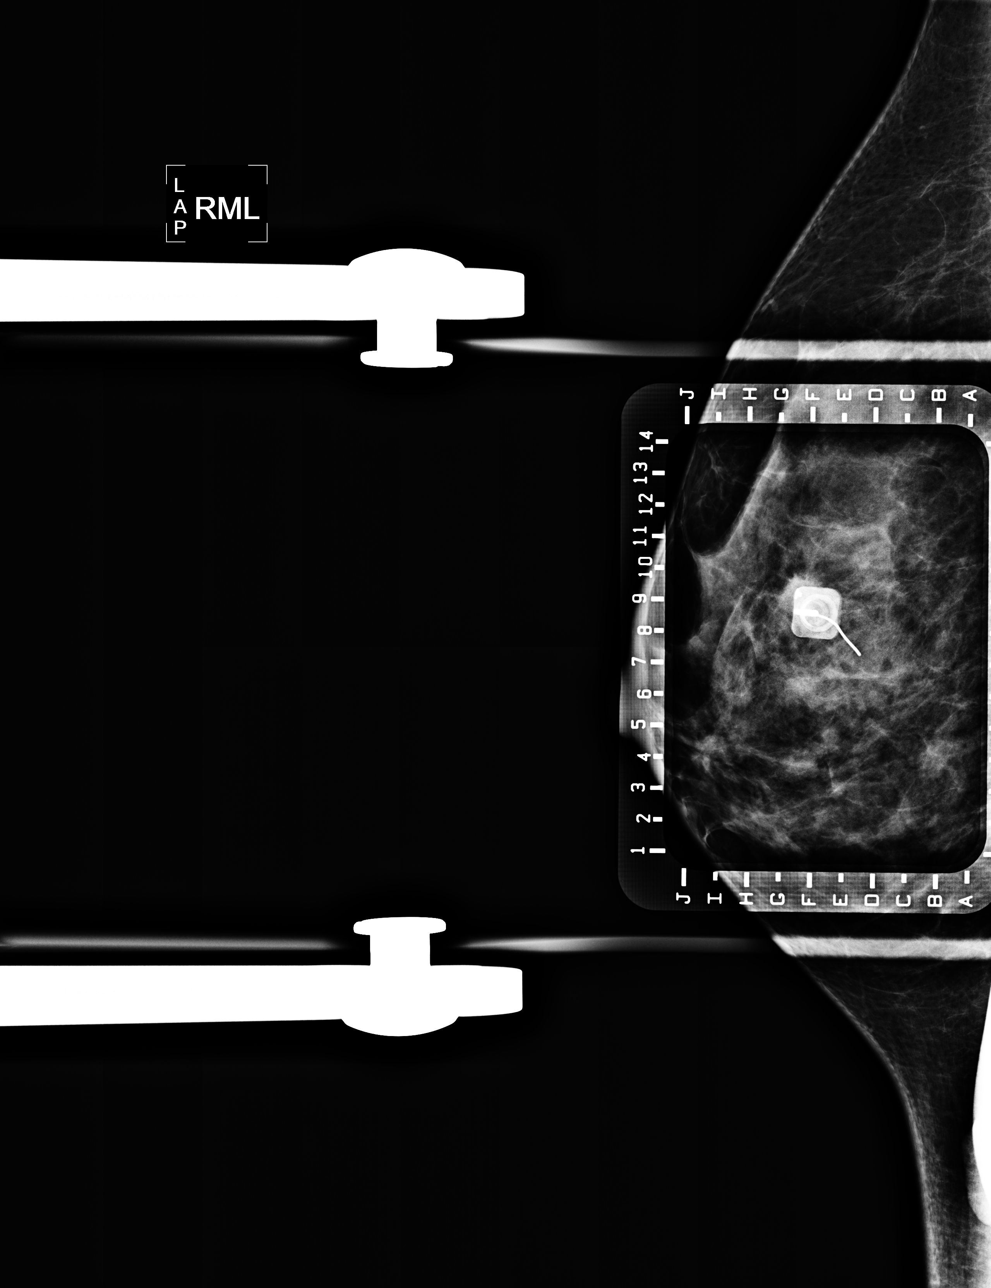

[4 of 4 positions shown; findings below may reference images not displayed]

RIGHT BREAST NEEDLE LOCALIZATION WITH MAMMOGRAPHIC GUIDANCE AND
SPECIMEN RADIOGRAPH

Patient presents for needle localization prior to surgical
excision. The patient and I discussed the procedure of needle
localization including risks, benefits and alternatives.
Specifically, we discussed the risks of infection, bleeding, tissue
injury and inadequate sampling. Informed written consent was given.

Using mammographic guidance, sterile technique, 2% lidocaine and a
7 cm modified Kopans needle, the biopsy site in the medial aspect
of the right breast was localized using a mediolateral approach.
Films were labeled and sent with the patient to surgery.  She
tolerated the procedure well.

Specimen radiograph was performed at [HOSPITAL] Day [HOSPITAL],
and confirms the clip and wire to be present in the tissue sample.
The specimen is marked for pathology.
IMPRESSION: Needle localization right breast.  No apparent complications.

## 2012-06-12 ENCOUNTER — Other Ambulatory Visit: Payer: Self-pay | Admitting: Physician Assistant

## 2012-06-12 DIAGNOSIS — D0590 Unspecified type of carcinoma in situ of unspecified breast: Secondary | ICD-10-CM

## 2012-06-13 ENCOUNTER — Ambulatory Visit (HOSPITAL_BASED_OUTPATIENT_CLINIC_OR_DEPARTMENT_OTHER): Payer: BC Managed Care – PPO | Admitting: Oncology

## 2012-06-13 ENCOUNTER — Other Ambulatory Visit (HOSPITAL_BASED_OUTPATIENT_CLINIC_OR_DEPARTMENT_OTHER): Payer: BC Managed Care – PPO | Admitting: Lab

## 2012-06-13 ENCOUNTER — Telehealth: Payer: Self-pay | Admitting: *Deleted

## 2012-06-13 VITALS — BP 92/69 | HR 83 | Temp 98.0°F | Resp 20 | Ht 64.0 in | Wt 140.1 lb

## 2012-06-13 DIAGNOSIS — D0591 Unspecified type of carcinoma in situ of right breast: Secondary | ICD-10-CM

## 2012-06-13 DIAGNOSIS — D0590 Unspecified type of carcinoma in situ of unspecified breast: Secondary | ICD-10-CM

## 2012-06-13 DIAGNOSIS — Z170 Estrogen receptor positive status [ER+]: Secondary | ICD-10-CM

## 2012-06-13 LAB — CBC WITH DIFFERENTIAL/PLATELET
Eosinophils Absolute: 0.1 10*3/uL (ref 0.0–0.5)
HGB: 12.4 g/dL (ref 11.6–15.9)
LYMPH%: 23.8 % (ref 14.0–49.7)
MONO#: 0.4 10*3/uL (ref 0.1–0.9)
NEUT#: 3 10*3/uL (ref 1.5–6.5)
Platelets: 139 10*3/uL — ABNORMAL LOW (ref 145–400)
RBC: 4.2 10*6/uL (ref 3.70–5.45)
WBC: 4.7 10*3/uL (ref 3.9–10.3)

## 2012-06-13 MED ORDER — TAMOXIFEN CITRATE 20 MG PO TABS: 20.0000 mg | ORAL_TABLET | Freq: Every day | ORAL | Status: DC

## 2012-06-13 NOTE — Progress Notes (Signed)
ID: Birdie Sons Bader   DOB: [birth year]  MR#: [id number]  WUJ#:[id number]  PCP: [name] Kyle, MD GYN: SUEmelia [name] OTHER MD:   HISTORY OF PRESENT ILLNESS:  [name] [name] had routine screening mammography November 2011 showing some calcifications in the right breast and a possible mass.  Diagnostic mammography November 17 showed normal appearing parenchyma at the location of the possible mass.  There was no change there and no true mass.  There were some loosely clustered faint calcifications in the medial portion of the breast, however.  These were felt likely to be benign and associated with fibrocystic change, but 21-month follow-up was suggested.   This was performed on May 4 when the patient had a diagnostic right mammogram, and this showed additional calcifications which also appeared more pleomorphic than on the prior exam.  Accordingly the patient proceeded directly to biopsy that same day and this showed ([id number]) ductal carcinoma in situ, intermediate grade.  The tumor was 98% estrogen and 98% progesterone receptor positive.   With this information, the patient was referred to Dr. Dwain [name] and bilateral breast MRI was obtained May 11.  This showed only post biopsy changes in the medial right breast, otherwise really no MR evidence of malignancy in either breast. On May 10 the patient had diagnostic mammogram of the left breast.  This showed some suspicious calcifications in the lower left breast, however, biopsy of this area May 20, 2010 ([id number]) showed only fibrocystic changes with no atypia or malignancy.   Accordingly on June 24, 2010 the patient underwent right wire guided lumpectomy.  The pathology from this procedure ([id number]) showed an intermediate grade ductal carcinoma in situ measuring 1 cm.  All the margins were clear, the closest being 2 mm. Her subsequent history is as detailed below.  INTERVAL HISTORY: [name] [name] returns today for followup of her breast  cancer. Interval history is unremarkable. She is tolerating tamoxifen well--hot flashes have pretty much faded away. She exercises regularly, especially by walking, which she does about 4 times a week.  REVIEW OF SYSTEMS: A detailed review of systems today is entirely negative.  PAST MEDICAL HISTORY: Past Medical History  Diagnosis Date  . Breast cancer     stage 0 breast cancer s/p lumpectomy    PAST SURGICAL HISTORY: Past Surgical History  Procedure Laterality Date  . Cesarean section      1999/2002  . Breast lumpectomy      right    FAMILY HISTORY The patient's parents are alive, in their 64's.  She has no brothers.  She has 1 sister There is no breast or ovarian or indeed any cancer in the immediate family   GYNECOLOGIC HISTORY: She is G X, P 2, menarche age 65, first pregnancy to term age 33. She is still menstruating, although perhaps a little bit more irregularly, and her periods are scant. 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 Her husband of 16 years [name] [name] used to be in Associate Professor ([employer]).  They have 2 sons, age 22 and 7.  They attend [name].   ADVANCED DIRECTIVES: in place  HEALTH MAINTENANCE: History  Substance Use Topics  . Smoking status: Never Smoker   . Smokeless tobacco: Not on file  . Alcohol Use: Yes     Colonoscopy:  PAP: UTD/ [name]  Bone density:  Lipid panel:  No Known Allergies  Current Outpatient Prescriptions  Medication Sig Dispense Refill  . CALCIUM PO Take by mouth daily.      . tamoxifen (NOLVADEX) 20 MG tablet Take 1 tablet (20 mg total) by mouth daily.  90 tablet  12   No current facility-administered medications for this visit.    OBJECTIVE: Middle-aged white woman who looks healthy Filed Vitals:   06/13/12 0907  BP: 92/69  Pulse: 83  Temp: 98 F (36.7 [name])  Resp: 20     Body mass  index is 24.04 kg/(m^2).    ECOG FS: 0  Sclerae unicteric Oropharynx clear No cervical or supraclavicular adenopathy Lungs no rales or rhonchi Heart regular rate and rhythm Abd benign MSK no focal spinal tenderness, no peripheral edema Neuro: nonfocal, well oriented, pleasant affect Breasts: The right breast is status post lumpectomy. There is no evidence of local recurrence. The right axilla is benign The left breast is unremarkable  LAB RESULTS: Lab Results  Component Value Date   WBC 4.7 06/13/2012   NEUTROABS 3.0 06/13/2012   HGB 12.4 06/13/2012   HCT 36.9 06/13/2012   MCV 87.8 06/13/2012   PLT 139* 06/13/2012      Chemistry      Component Value Date/Time   NA 137 06/29/2010 1606   K 3.7 06/29/2010 1606   CL 101 06/29/2010 1606   CO2 27 06/29/2010 1606   BUN 13 06/29/2010 1606   CREATININE 0.58 06/29/2010 1606      Component Value Date/Time   CALCIUM 9.5 06/29/2010 1606   ALKPHOS 65 06/29/2010 1606   AST 19 06/29/2010 1606   ALT 13 06/29/2010 1606   BILITOT 0.3 06/29/2010 1606       Lab Results  Component Value Date   LABCA2 22 06/17/2010    No components found with this basename: [id number]    No results found for this basename: INR,  in the last 168 hours  Urinalysis No results found for this basename: colorurine,  appearanceur,  labspec,  phurine,  glucoseu,  hgbur,  bilirubinur,  ketonesur,  proteinur,  urobilinogen,  nitrite,  leukocytesur    STUDIES: Mammography 11/20/2011 was unremarkable  ASSESSMENT: 47 y.o. [name] [name] woman status post right lumpectomy June 2012 for a 1 cm, grade 2 ductal carcinoma in situ, strongly estrogen and progesterone receptor positive, status post radiation completed at the end of August 2012, with tamoxifen started early September 2012.   PLAN: [name] [name] is doing terrific from a breast cancer point of view. She sees Dr. Cherly [name] in June, so to optimize her followup we are going to start seeing her in January, it did after her November mammogram.  The plan is to continue tamoxifen to a total of 5 years. We will check lab work a few days before her next visit here. She knows to call for any problems that may develop before then.  [name] [name]    06/13/2012

## 2012-06-13 NOTE — Telephone Encounter (Signed)
appts made and printed...td 

## 2012-06-19 ENCOUNTER — Encounter: Payer: Self-pay | Admitting: Radiation Oncology

## 2012-06-20 ENCOUNTER — Ambulatory Visit: Payer: BC Managed Care – PPO | Admitting: Radiation Oncology

## 2012-06-28 ENCOUNTER — Ambulatory Visit
Admission: RE | Admit: 2012-06-28 | Discharge: 2012-06-28 | Disposition: A | Discharge status: Home / Self Care | Payer: BC Managed Care – PPO | Source: Ambulatory Visit | Attending: Radiation Oncology | Admitting: Radiation Oncology

## 2012-06-28 ENCOUNTER — Encounter: Payer: Self-pay | Admitting: Radiation Oncology

## 2012-06-28 VITALS — BP 101/67 | HR 73 | Temp 97.6°F | Resp 20 | Wt 141.1 lb

## 2012-06-28 DIAGNOSIS — D0591 Unspecified type of carcinoma in situ of right breast: Secondary | ICD-10-CM

## 2012-06-28 HISTORY — DX: Personal history of irradiation: Z92.3

## 2012-06-28 NOTE — Progress Notes (Addendum)
Follow up right breast rad txs:07/27/10-09/09/10, no c.o pain , last mammogram 11/16/11, appetite good, taking tamoxifen 20 mg po daily, no hot flashes 2:59 PM  Editing note so that physician can close encounter.

## 2012-06-28 NOTE — Progress Notes (Signed)
   Department of Radiation Oncology  Phone:  [phone] Fax:        [phone]   Name: [name] [name] MRN: [id number]  DOB: June 22, 1965  Date: 06/28/2012  Follow Up Visit Note  Diagnosis: DCIS of the right breast   Summary and Interval since last radiation: 61 Gy completed 09/09/2010  Interval History: [name] [name] presents today for routine followup.  She is doing well. Tolerating tamoxifen well with no hot flashes. No breast related complaints. Mammogram on 11/16/11 was normal.  Allergies: No Known Allergies  Medications:  Current Outpatient Prescriptions  Medication Sig Dispense Refill  . CALCIUM PO Take by mouth daily.      . tamoxifen (NOLVADEX) 20 MG tablet Take 1 tablet (20 mg total) by mouth daily.  90 tablet  12   No current facility-administered medications for this encounter.    Physical Exam:  Filed Vitals:   06/28/12 1457  BP: 101/67  Pulse: 73  Temp: 97.6 F (36.4 C)  Resp: 20   Scar is well healed. No palpable abnormalities.  IMPRESSION: [name] [name] is a 47 y.o. female s/p breast conservation NED.  PLAN:  Looks good. Has follow up with medical oncology. Follow up with me prn. Call with questions.    Lurline Hare, MD

## 2012-10-11 ENCOUNTER — Ambulatory Visit (INDEPENDENT_AMBULATORY_CARE_PROVIDER_SITE_OTHER): Payer: BC Managed Care – PPO | Admitting: General Surgery

## 2012-10-14 ENCOUNTER — Encounter (INDEPENDENT_AMBULATORY_CARE_PROVIDER_SITE_OTHER): Payer: Self-pay | Admitting: General Surgery

## 2012-10-14 ENCOUNTER — Ambulatory Visit (INDEPENDENT_AMBULATORY_CARE_PROVIDER_SITE_OTHER): Payer: BC Managed Care – PPO | Admitting: General Surgery

## 2012-10-14 VITALS — BP 114/68 | HR 72 | Temp 97.8°F | Resp 14 | Ht 64.5 in | Wt 146.0 lb

## 2012-10-14 DIAGNOSIS — D0590 Unspecified type of carcinoma in situ of unspecified breast: Secondary | ICD-10-CM

## 2012-10-14 DIAGNOSIS — D0591 Unspecified type of carcinoma in situ of right breast: Secondary | ICD-10-CM

## 2012-10-14 NOTE — Patient Instructions (Signed)
Breast Self-Examination You should begin examining your breasts at age 47 even though the risk for breast cancer is low in this age group. It is important to become familiar with how your breasts look and feel. This is true for pregnant women, nursing mothers, women in menopause and women who have breast implants.  Women should examine their breasts once a month to look for changes and lumps. By doing monthly breast exams, you get to know how your breasts feel and how they can change from month to month. This allows you to pick up changes early. It can also offer you some reassurance that your breast health is good. This exam only takes minutes. Most breast lumps are not caused by cancer. If you find a lump, a special x-ray called a mammogram, or other tests may be needed to determine what is wrong.  Some of the signs that a breast lump is caused by cancer include:  Dimpling of the skin or changes in the shape of the breast or nipple.   A dark-colored or bloody discharge from the nipple.   Swollen lymph glands around the breast or in the armpit.   Redness of the breast or nipple.   Scaly nipple or skin on the breast.   Pain or swelling of the breast.  SELF-EXAM There are a few points to follow when doing a thorough breast exam. The best time to examine your breasts is 5 to 7 days after the menstrual period is over. During menstruation, the breasts are lumpier, and it may be more difficult to pick up changes. If you do not menstruate, have reached menopause or had a hysterectomy, examine your breasts the first day of every month. After three to four months, you will become more familiar with the variations of your breasts and more comfortable with the exam.  Perform your breast exam monthly. Keep a written record with breast changes or normal findings for each breast. This makes it easier to be sure of changes and to not solely depend on memory for size, tenderness, or location. Try to do the exam  at the same time each month, and write down where you are in your menstrual cycle if you are still menstruating.   Look at your breasts. Stand in front of a mirror with your hands clasped behind your head. Tighten your chest muscles and look for asymmetry. This means a difference in shape or contour from one breast to the other, such as puckers, dips or bumps. Look also for skin changes.   Lean forward with your hands on your hips. Again, look for symmetry and skin changes.   While showering, soap the breasts, and carefully feel the breasts with fingertips while holding the arm (on the side of the breast being examined) over the head. Do this with each breast carefully feeling for lumps or changes. Typically, a circular motion with moderate fingertip pressure should be used.   Repeat this exam while lying on your back, again with your arm behind your head and a pillow under your shoulders. Again, use your fingertips to examine both breasts, feeling for lumps and thickening. Begin at 1 o'clock and go clockwise around the whole breast.   At the end of your exam, gently squeeze each nipple to see if there is any drainage. Look for nipple changes, dimpling or redness.   Lastly, examine the upper chest and clavicle areas and in your armpits.  It is not necessary to become alarmed if you find   a breast lump. Most of them are not cancerous. However, it is necessary to see your caregiver if a lump is found in order to have it looked at. Document Released: 02/03/2004 Document Revised: 09/07/2010 Document Reviewed: 04/14/2008 [employer] Patient Information 2012 [employer]. 

## 2012-10-14 NOTE — Progress Notes (Signed)
Subjective:     Patient ID: [name] [name], female   DOB: 1965-07-03, 47 y.o.   MRN: [id number]  HPI 57 yof who underwent right breast lumpectomy for DCIS.  She has mm in 11/13 that is normal.  She reports no complaints referable to either breast and is tolerating her tamoxifen well.  She has no discharge from either breast.  She comes in for 6 month follow up.  Review of Systems DIGITAL DIAGNOSTIC BILATERAL MAMMOGRAM WITH CAD  Comparison: 11/14/2010 and earlier  Findings: Breast parenchyma is heterogeneously dense.  Postoperative changes are identified in the medial aspect of the  right breast. No suspicious mass, distortion, or  microcalcifications are identified to suggest presence of  malignancy. Spot tangential view of the lumpectomy site is  negative.  Mammographic images were processed with CAD.  IMPRESSION:  No mammographic evidence for malignancy.  RECOMMENDATION:  Diagnostic mammogram is suggested in 1 year.  I have discussed the findings and recommendations with the patient.  Results were also provided in writing at the conclusion of the  visit.     Objective:   Physical Exam  Pulmonary/Chest:         Assessment:     Clinical stage 0 right breast cancer    Plan:     She has no clinical evidence of recurrence.  She is overall healthy and doing well.  She is tolerating tamoxifen also.  I will plan on seeing back in a year.  She will get mm in November, continue self exams and clinical exams every 6 months.

## 2012-10-22 ENCOUNTER — Other Ambulatory Visit: Payer: Self-pay | Admitting: Oncology

## 2012-10-22 DIAGNOSIS — Z853 Personal history of malignant neoplasm of breast: Secondary | ICD-10-CM

## 2012-11-02 ENCOUNTER — Emergency Department (HOSPITAL_COMMUNITY)
Admission: EM | Admit: 2012-11-02 | Discharge: 2012-11-03 | Disposition: A | Discharge status: Home / Self Care | Payer: BC Managed Care – PPO | Attending: Emergency Medicine | Admitting: Emergency Medicine

## 2012-11-02 ENCOUNTER — Encounter (HOSPITAL_COMMUNITY): Payer: Self-pay | Admitting: Emergency Medicine

## 2012-11-02 ENCOUNTER — Emergency Department (HOSPITAL_COMMUNITY): Payer: BC Managed Care – PPO

## 2012-11-02 ENCOUNTER — Emergency Department (INDEPENDENT_AMBULATORY_CARE_PROVIDER_SITE_OTHER)
Admission: EM | Admit: 2012-11-02 | Discharge: 2012-11-02 | Disposition: A | Discharge status: Home / Self Care | Payer: BC Managed Care – PPO | Source: Home / Self Care

## 2012-11-02 DIAGNOSIS — Z853 Personal history of malignant neoplasm of breast: Secondary | ICD-10-CM | POA: Insufficient documentation

## 2012-11-02 DIAGNOSIS — S298XXA Other specified injuries of thorax, initial encounter: Secondary | ICD-10-CM | POA: Insufficient documentation

## 2012-11-02 DIAGNOSIS — J159 Unspecified bacterial pneumonia: Secondary | ICD-10-CM | POA: Insufficient documentation

## 2012-11-02 DIAGNOSIS — Z792 Long term (current) use of antibiotics: Secondary | ICD-10-CM | POA: Insufficient documentation

## 2012-11-02 DIAGNOSIS — Y929 Unspecified place or not applicable: Secondary | ICD-10-CM | POA: Insufficient documentation

## 2012-11-02 DIAGNOSIS — R059 Cough, unspecified: Secondary | ICD-10-CM

## 2012-11-02 DIAGNOSIS — J189 Pneumonia, unspecified organism: Secondary | ICD-10-CM

## 2012-11-02 DIAGNOSIS — Y9389 Activity, other specified: Secondary | ICD-10-CM | POA: Insufficient documentation

## 2012-11-02 DIAGNOSIS — R05 Cough: Secondary | ICD-10-CM

## 2012-11-02 DIAGNOSIS — J988 Other specified respiratory disorders: Secondary | ICD-10-CM

## 2012-11-02 DIAGNOSIS — X500XXA Overexertion from strenuous movement or load, initial encounter: Secondary | ICD-10-CM | POA: Insufficient documentation

## 2012-11-02 MED ORDER — HYDROCODONE-HOMATROPINE 5-1.5 MG/5ML PO SYRP: 5.0000 mL | ORAL_SOLUTION | Freq: Four times a day (QID) | ORAL | Status: DC | PRN

## 2012-11-02 MED ORDER — AZITHROMYCIN 250 MG PO TABS: 250.0000 mg | ORAL_TABLET | Freq: Every day | ORAL | Status: DC

## 2012-11-02 NOTE — ED Provider Notes (Signed)
Medical screening examination/treatment/procedure(s) were performed by non-physician practitioner and as supervising physician I was immediately available for consultation/collaboration.  Leslee Home, M.D.  Reuben Likes, MD 11/02/12 2108

## 2012-11-02 NOTE — ED Provider Notes (Signed)
CSN: [id number]     Arrival date & time 11/02/12  0900 History   None    No chief complaint on file.  (Consider location/radiation/quality/duration/timing/severity/associated sxs/prior Treatment) HPI Comments: Patient is a very pleasant 47 yo female with a 7 day history of productive cough, chills at night, and overall malaise. No documented fevers, no upper respiratory co's. Using Advil OTC without much relief.   The history is provided by the patient.    Past Medical History  Diagnosis Date  . Breast cancer     stage 0 breast cancer s/p lumpectomy  . History of radiation therapy 07/27/10-09/09/10    right breast 61GYtotal/53fx   Past Surgical History  Procedure Laterality Date  . Cesarean section      1999/2002  . Breast lumpectomy      right   No family history on file. History  Substance Use Topics  . Smoking status: Never Smoker   . Smokeless tobacco: Never Used  . Alcohol Use: Yes   OB History   Grav Para Term Preterm Abortions TAB SAB Ect Mult Living                 Review of Systems  All other systems reviewed and are negative.    Allergies  Review of patient's allergies indicates no known allergies.  Home Medications   Current Outpatient Rx  Name  Route  Sig  Dispense  Refill  . azithromycin (ZITHROMAX) 250 MG tablet   Oral   Take 1 tablet (250 mg total) by mouth daily. Take first 2 tablets together, then 1 every day until finished.   6 tablet   0   . CALCIUM PO   Oral   Take by mouth daily.         Marland Kitchen HYDROcodone-homatropine (HYCODAN) 5-1.5 MG/5ML syrup   Oral   Take 5 mLs by mouth every 6 (six) hours as needed for cough.   180 mL   0   . tamoxifen (NOLVADEX) 20 MG tablet   Oral   Take 1 tablet (20 mg total) by mouth daily.   90 tablet   12    BP 98/69  Pulse 80  Temp(Src) 98.8 F (37.1 C) (Oral)  Resp 18  SpO2 97% Physical Exam  Constitutional: She is oriented to person, place, and time. She appears well-developed and  well-nourished. No distress.  HENT:  Head: Normocephalic and atraumatic.  Right Ear: External ear normal.  Left Ear: External ear normal.  Mouth/Throat: Oropharynx is clear and moist.  Eyes: Conjunctivae are normal. Pupils are equal, round, and reactive to light.  Neck: Normal range of motion.  Cardiovascular: Normal rate, regular rhythm and normal heart sounds.   Pulmonary/Chest: Effort normal. No respiratory distress. She has no wheezes. She has no rales.  Few course BS in the right lower base  Neurological: She is alert and oriented to person, place, and time.  Skin: Skin is warm and dry.  Psychiatric: Her behavior is normal.    ED Course  Procedures (including critical care time) Labs Review Labs Reviewed - No data to display Imaging Review No results found.  EKG Interpretation     Ventricular Rate:    PR Interval:    QRS Duration:   QT Interval:    QTC Calculation:   R Axis:     Text Interpretation:              MDM   1. Respiratory tract infection   2. Cough  Given duration treat with ABX and symptomatic relief. F/U if worsens.     Riki Sheer, PA-C 11/02/12 [id number]

## 2012-11-02 NOTE — ED Notes (Signed)
Patient is alert and oriented x3.  She is complaining of right rib area pain after a coughing spell that she has been having for over a week.  Tonight she states that she was coughing and had a pop in her right side.  She also states she has shortness of breath after this happened

## 2012-11-02 NOTE — ED Notes (Signed)
Seen by provider prior to this nurse. C/o cough, low fever onset last saturday

## 2012-11-03 LAB — CBC
HCT: 32.4 % — ABNORMAL LOW (ref 36.0–46.0)
MCV: 87.8 fL (ref 78.0–100.0)
RBC: 3.69 MIL/uL — ABNORMAL LOW (ref 3.87–5.11)
RDW: 12.2 % (ref 11.5–15.5)
WBC: 9.3 10*3/uL (ref 4.0–10.5)

## 2012-11-03 LAB — POCT I-STAT, CHEM 8
BUN: 4 mg/dL — ABNORMAL LOW (ref 6–23)
Chloride: 101 mEq/L (ref 96–112)
Creatinine, Ser: 0.8 mg/dL (ref 0.50–1.10)
Glucose, Bld: 131 mg/dL — ABNORMAL HIGH (ref 70–99)
Potassium: 3.8 mEq/L (ref 3.5–5.1)
Sodium: 138 mEq/L (ref 135–145)
TCO2: 22 mmol/L (ref 0–100)

## 2012-11-03 MED ORDER — ONDANSETRON HCL 4 MG/2ML IJ SOLN
4.0000 mg | Freq: Once | INTRAMUSCULAR | Status: AC
  Administered 2012-11-03: 4 mg via INTRAVENOUS
  Filled 2012-11-03: qty 2

## 2012-11-03 MED ORDER — LEVOFLOXACIN IN D5W 750 MG/150ML IV SOLN
750.0000 mg | INTRAVENOUS | Status: DC
  Administered 2012-11-03: 750 mg via INTRAVENOUS
  Filled 2012-11-03: qty 150

## 2012-11-03 MED ORDER — FENTANYL CITRATE 0.05 MG/ML IJ SOLN: 50.0000 ug | INTRAMUSCULAR | Status: DC | PRN

## 2012-11-03 MED ORDER — LEVOFLOXACIN 500 MG PO TABS: 500.0000 mg | ORAL_TABLET | Freq: Every day | ORAL | Status: DC

## 2012-11-03 NOTE — ED Provider Notes (Signed)
CSN: [id number]     Arrival date & time 11/02/12  2321 History   First MD Initiated Contact with Patient 11/02/12 2335     Chief Complaint  Patient presents with  . Chest Pain   (Consider location/radiation/quality/duration/timing/severity/associated sxs/prior Treatment) HPI Hx per PT - has been sick for the last week with cough and some subjective fevers and productive cough. She'll size and overall malaise. No sick contacts. No recent travel. No rash. No abdominal pain. No nausea vomiting. She went to urgent care today and was given prescription for Z-Pak and Hycodan cough medication.  Tonight at dinner with coughing developed sharp right-sided chest pains and now hurts to cough and take a deep breath. She states that she felt a pop in her rib. No difficulty breathing otherwise. No leg pain or leg swelling no history of DVT. Patient believes that she has walking pneumonia. Did not get an x-ray at the urgent care.  States she normally has very low blood pressure and that systolic typically is 91-96 and has been much lower at her doctors office  Past Medical History  Diagnosis Date  . Breast cancer     stage 0 breast cancer s/p lumpectomy  . History of radiation therapy 07/27/10-09/09/10    right breast 61GYtotal/52fx   Past Surgical History  Procedure Laterality Date  . Cesarean section      1999/2002  . Breast lumpectomy      right   History reviewed. No pertinent family history. History  Substance Use Topics  . Smoking status: Never Smoker   . Smokeless tobacco: Never Used  . Alcohol Use: Yes   OB History   Grav Para Term Preterm Abortions TAB SAB Ect Mult Living                 Review of Systems  Constitutional: Positive for fever. Negative for chills and diaphoresis.  Respiratory: Positive for cough.   Cardiovascular: Positive for chest pain.  Gastrointestinal: Negative for vomiting and abdominal pain.  Genitourinary: Negative for dysuria.  Musculoskeletal: Negative  for back pain, neck pain and neck stiffness.  Skin: Negative for rash.  Neurological: Negative for headaches.  All other systems reviewed and are negative.    Allergies  Review of patient's allergies indicates no known allergies.  Home Medications   Current Outpatient Rx  Name  Route  Sig  Dispense  Refill  . azithromycin (ZITHROMAX) 250 MG tablet   Oral   Take 1 tablet (250 mg total) by mouth daily. Take first 2 tablets together, then 1 every day until finished.   6 tablet   0   . ibuprofen (ADVIL,MOTRIN) 200 MG tablet   Oral   Take 400 mg by mouth every 6 (six) hours as needed for pain.         . tamoxifen (NOLVADEX) 20 MG tablet   Oral   Take 20 mg by mouth every evening.         Marland Kitchen HYDROcodone-homatropine (HYCODAN) 5-1.5 MG/5ML syrup   Oral   Take 5 mLs by mouth every 6 (six) hours as needed for cough.   180 mL   0    BP 90/59  Pulse 93  Temp(Src) 98.1 F (36.7 C) (Oral)  Resp 16  SpO2 100%  LMP 10/09/2012 Physical Exam  Constitutional: She is oriented to person, place, and time. She appears well-developed and well-nourished.  HENT:  Head: Normocephalic and atraumatic.  Eyes: EOM are normal. Pupils are equal, round, and reactive to  light.  Neck: Neck supple.  Cardiovascular: Normal rate, regular rhythm and intact distal pulses.   Pulmonary/Chest: Effort normal. No respiratory distress.  Mildly decreased breath sounds on the right without wheezes or rales or rhonchi Right lateral chest wall tenderness without crepitus.  Abdominal: Soft. She exhibits no distension. There is no tenderness.  No right upper quadrant tenderness and negative Murphy's sign.  Musculoskeletal: Normal range of motion. She exhibits no edema.  Neurological: She is alert and oriented to person, place, and time.  Skin: Skin is warm and dry.    ED Course  Procedures (including critical care time) Labs Review Labs Reviewed  CBC - Abnormal; Notable for the following:    RBC 3.69  (*)    Hemoglobin 11.1 (*)    HCT 32.4 (*)    All other components within normal limits  POCT I-STAT, CHEM 8 - Abnormal; Notable for the following:    BUN 4 (*)    Glucose, Bld 131 (*)    Hemoglobin 11.2 (*)    HCT 33.0 (*)    All other components within normal limits  CG4 I-STAT (LACTIC ACID)   Imaging Review Dg Chest 2 View  11/03/2012   CLINICAL DATA:  Right mid chest pain and cough. Shortness of breath.  EXAM: CHEST  2 VIEW  COMPARISON:  Chest radiograph performed 03/28/2004  FINDINGS: The lungs are well expanded. Patchy right-sided airspace opacification is compatible with pneumonia. Minimal left midlung opacity is suggested. No pleural effusion or pneumothorax is seen.  The heart is normal in size. No acute osseous abnormalities are identified. Clips are seen overlying the right breast.  IMPRESSION: Patchy right-sided airspace opacification, compatible with pneumonia; minimal left midlung airspace opacity also suggested.   Electronically Signed   By: Roanna Raider M.D.   On: 11/03/2012 00:03   Room air pulse ox 100% is adequate. IV fluids. IV antibiotics for bilateral pneumonia. IV fentanyl pain control.  Normal lactate and patient reports this is her baseline blood pressure.  Plan discharge home with prescription for Levaquin, hold off taking her Z-Pak.  She has prescription for Hycodan cough syrup that she will take his pain medication as well.  She agrees to close outpatient followup and strict return precautions for any worsening condition. Reliable historian/stable for discharge home. MDM  Diagnosis: Community acquired pneumonia  Treated with Levaquin for bilateral pneumonia No hypoxia or abnormal labs or otherwise indication for admit at this time Chest x-ray reviewed as above Labs reviewed as above IV antibiotics, IV narcotics - pain improved Vital signs and nurses notes reviewed and considered   Sunnie Nielsen, MD 11/03/12 [phone]

## 2012-11-03 NOTE — ED Notes (Signed)
Patient is alert and oriented x3.  She was given DC instructions and follow up visit instructions.  Patient gave verbal understanding. She was DC ambulatory under her own power to home.  V/S stable.  He was not showing any signs of distress on DC 

## 2012-11-05 ENCOUNTER — Telehealth: Payer: Self-pay | Admitting: *Deleted

## 2012-11-05 NOTE — Telephone Encounter (Signed)
[place] called to state concern due to absence of menses this month with inquiry if related to use of tamoxifen.  Pt states " I know I am not pregnant because my husband had a vastectomy 7 years ago ".  This RN discussed with pt use of tamoxifen and possible changes in uterine lining that could cause interuption in menses.  Pt is to monitor and record cycle and if she has a second missed menses she needs to follow up with GYN.  Note her last GYN exam was early 2014.  Pt has no other complaints and will call this office with update for our records.

## 2012-11-15 ENCOUNTER — Other Ambulatory Visit: Payer: Self-pay | Admitting: Oncology

## 2012-11-15 ENCOUNTER — Other Ambulatory Visit: Payer: Self-pay

## 2012-11-15 DIAGNOSIS — Z853 Personal history of malignant neoplasm of breast: Secondary | ICD-10-CM

## 2012-11-22 ENCOUNTER — Ambulatory Visit
Admission: RE | Admit: 2012-11-22 | Discharge: 2012-11-22 | Disposition: A | Discharge status: Home / Self Care | Payer: BC Managed Care – PPO | Source: Ambulatory Visit | Attending: Oncology | Admitting: Oncology

## 2012-11-22 DIAGNOSIS — Z853 Personal history of malignant neoplasm of breast: Secondary | ICD-10-CM

## 2013-01-10 ENCOUNTER — Other Ambulatory Visit (HOSPITAL_BASED_OUTPATIENT_CLINIC_OR_DEPARTMENT_OTHER): Payer: BC Managed Care – PPO

## 2013-01-10 DIAGNOSIS — D0590 Unspecified type of carcinoma in situ of unspecified breast: Secondary | ICD-10-CM

## 2013-01-10 DIAGNOSIS — D0591 Unspecified type of carcinoma in situ of right breast: Secondary | ICD-10-CM

## 2013-01-10 LAB — COMPREHENSIVE METABOLIC PANEL (CC13)
ALBUMIN: 3.8 g/dL (ref 3.5–5.0)
ALK PHOS: 54 U/L (ref 40–150)
ALT: 21 U/L (ref 0–55)
AST: 24 U/L (ref 5–34)
Anion Gap: 10 mEq/L (ref 3–11)
BUN: 14.8 mg/dL (ref 7.0–26.0)
CO2: 24 mEq/L (ref 22–29)
Calcium: 9.6 mg/dL (ref 8.4–10.4)
Chloride: 105 mEq/L (ref 98–109)
Creatinine: 0.8 mg/dL (ref 0.6–1.1)
Glucose: 98 mg/dl (ref 70–140)
Potassium: 4.6 mEq/L (ref 3.5–5.1)
Sodium: 139 mEq/L (ref 136–145)
Total Bilirubin: 0.57 mg/dL (ref 0.20–1.20)
Total Protein: 7.6 g/dL (ref 6.4–8.3)

## 2013-01-10 LAB — CBC WITH DIFFERENTIAL/PLATELET
BASO%: 0.5 % (ref 0.0–2.0)
Basophils Absolute: 0 10*3/uL (ref 0.0–0.1)
EOS%: 3.3 % (ref 0.0–7.0)
Eosinophils Absolute: 0.2 10*3/uL (ref 0.0–0.5)
HCT: 39.6 % (ref 34.8–46.6)
HGB: 13.2 g/dL (ref 11.6–15.9)
LYMPH%: 20.7 % (ref 14.0–49.7)
MCH: 30 pg (ref 25.1–34.0)
MCHC: 33.3 g/dL (ref 31.5–36.0)
MCV: 90.1 fL (ref 79.5–101.0)
MONO#: 0.5 10*3/uL (ref 0.1–0.9)
MONO%: 8 % (ref 0.0–14.0)
NEUT#: 3.9 10*3/uL (ref 1.5–6.5)
NEUT%: 67.5 % (ref 38.4–76.8)
Platelets: 163 10*3/uL (ref 145–400)
RBC: 4.39 10*6/uL (ref 3.70–5.45)
RDW: 14 % (ref 11.2–14.5)
WBC: 5.8 10*3/uL (ref 3.9–10.3)
lymph#: 1.2 10*3/uL (ref 0.9–3.3)

## 2013-01-13 ENCOUNTER — Other Ambulatory Visit: Payer: BC Managed Care – PPO

## 2013-01-16 ENCOUNTER — Ambulatory Visit (HOSPITAL_BASED_OUTPATIENT_CLINIC_OR_DEPARTMENT_OTHER): Payer: BC Managed Care – PPO | Admitting: Oncology

## 2013-01-16 ENCOUNTER — Telehealth: Payer: Self-pay | Admitting: *Deleted

## 2013-01-16 VITALS — BP 110/76 | HR 67 | Temp 97.7°F | Resp 18 | Ht 64.5 in | Wt 132.1 lb

## 2013-01-16 DIAGNOSIS — D0590 Unspecified type of carcinoma in situ of unspecified breast: Secondary | ICD-10-CM

## 2013-01-16 DIAGNOSIS — Z170 Estrogen receptor positive status [ER+]: Secondary | ICD-10-CM

## 2013-01-16 NOTE — Telephone Encounter (Signed)
appts made and printed...td 

## 2013-01-16 NOTE — Progress Notes (Signed)
ID: Orlan Leavens Pandey   DOB: [birth year]  MR#: [id number]  [id number]  PCP: Marvene Staff, MD GYN: SURolm Bookbinder OTHER MD:   HISTORY OF PRESENT ILLNESS:  [name] [name] had routine screening mammography November 2011 showing some calcifications in the right breast and a possible mass.  Diagnostic mammography November 17 showed normal appearing parenchyma at the location of the possible mass.  There was no change there and no true mass.  There were some loosely clustered faint calcifications in the medial portion of the breast, however.  These were felt likely to be benign and associated with fibrocystic change, but 39-month follow-up was suggested.   This was performed on May 4 when the patient had a diagnostic right mammogram, and this showed additional calcifications which also appeared more pleomorphic than on the prior exam.  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  The tumor was 98% estrogen and 98% progesterone receptor positive.   With this information, the patient was referred to Dr. Donne Hazel and bilateral breast MRI was obtained May 11.  This showed only post biopsy changes in the medial right breast, otherwise really no MR evidence of malignancy in either breast. On May 10 the patient had diagnostic mammogram of the left breast.  This showed some suspicious calcifications in the lower left breast, however, biopsy of this area May 20, 2010 ([id number]) showed only fibrocystic changes with no atypia or malignancy.   Accordingly on June 24, 2010 the patient underwent right wire guided lumpectomy.  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  All the margins were clear, the closest being 2 mm. Her subsequent history is as detailed below.  INTERVAL HISTORY: [name] [name] returns today for followup of her breast  cancer. Interval history is unremarkable. She is tolerating tamoxifen without any side effects that she is aware of. In particular she does not complain of hot flashes or vaginal dryness or wetness.  REVIEW OF SYSTEMS: She is having a little bit of insomnia. Her periods are becoming a little irregular. Otherwise a detailed review of systems today was entirely noncontributory. She is exercising 4-5 times a week at their clubs gym.  PAST MEDICAL HISTORY: Past Medical History  Diagnosis Date  . Breast cancer     stage 0 breast cancer s/p lumpectomy  . History of radiation therapy 07/27/10-09/09/10    right breast 61GYtotal/42fx    PAST SURGICAL HISTORY: Past Surgical History  Procedure Laterality Date  . Cesarean section      1999/2002  . Breast lumpectomy      right    FAMILY HISTORY The patient's parents are alive, in their 11's.  She has no brothers.  She has 1 sister There is no breast or ovarian or indeed any cancer in the immediate family   GYNECOLOGIC HISTORY: She is G X, P 2, menarche age 70, first pregnancy to term age 85. She is still menstruating, although perhaps a little bit more irregularly, and her periods are scant. 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 Her job involves a good deal of traveling. Her husband of 17 years Richardson Landry used to be in Proofreader ([place]).  They have 2 sons, age 26 and 44.  They attend [employer].   ADVANCED DIRECTIVES: in place  HEALTH MAINTENANCE: History  Substance Use Topics  .  Smoking status: Never Smoker   . Smokeless tobacco: Never Used  . Alcohol Use: Yes     Colonoscopy:  PAP: UTD/ [name]  Bone density:  Lipid panel:  No Known Allergies  Current Outpatient Prescriptions  Medication Sig Dispense Refill  . HYDROcodone-homatropine (HYCODAN) 5-1.5 MG/5ML syrup Take 5 mLs by mouth every 6 (six) hours as needed for cough.  180 mL  0  .  ibuprofen (ADVIL,MOTRIN) 200 MG tablet Take 400 mg by mouth every 6 (six) hours as needed for pain.      Marland Kitchen levofloxacin (LEVAQUIN) 500 MG tablet Take 1 tablet (500 mg total) by mouth daily.  10 tablet  0  . tamoxifen (NOLVADEX) 20 MG tablet Take 20 mg by mouth every evening.       No current facility-administered medications for this visit.    OBJECTIVE: Middle-aged white woman in no acute distress, Filed Vitals:   01/16/13 0922  BP: 110/76  Pulse: 67  Temp: 97.7 F (36.5 C)  Resp: 18     Body mass index is 22.33 kg/(m^2).    ECOG FS: 0  Sclerae unicteric, pupils equal and round Oropharynx clear and moist No cervical or supraclavicular adenopathy Lungs no rales or rhonchi Heart regular rate and rhythm Abd soft, nontender, positive bowel sounds MSK no focal spinal tenderness, no peripheral edema Neuro: nonfocal, well oriented, pleasant affect Breasts: The right breast is status post lumpectomy. There is no evidence of local recurrence. The right axilla is benign The left breast is unremarkable  LAB RESULTS: Lab Results  Component Value Date   WBC 5.8 01/10/2013   NEUTROABS 3.9 01/10/2013   HGB 13.2 01/10/2013   HCT 39.6 01/10/2013   MCV 90.1 01/10/2013   PLT 163 01/10/2013      Chemistry      Component Value Date/Time   NA 139 01/10/2013 1301   NA 138 11/03/2012 0050   K 4.6 01/10/2013 1301   K 3.8 11/03/2012 0050   CL 101 11/03/2012 0050   CO2 24 01/10/2013 1301   CO2 27 06/29/2010 1606   BUN 14.8 01/10/2013 1301   BUN 4* 11/03/2012 0050   CREATININE 0.8 01/10/2013 1301   CREATININE 0.80 11/03/2012 0050      Component Value Date/Time   CALCIUM 9.6 01/10/2013 1301   CALCIUM 9.5 06/29/2010 1606   ALKPHOS 54 01/10/2013 1301   ALKPHOS 65 06/29/2010 1606   AST 24 01/10/2013 1301   AST 19 06/29/2010 1606   ALT 21 01/10/2013 1301   ALT 13 06/29/2010 1606   BILITOT 0.57 01/10/2013 1301   BILITOT 0.3 06/29/2010 1606       Lab Results  Component Value Date   LABCA2 22 06/17/2010    No  components found with this basename: [id number]    No results found for this basename: INR,  in the last 168 hours  Urinalysis No results found for this basename: colorurine,  appearanceur,  labspec,  phurine,  glucoseu,  hgbur,  bilirubinur,  ketonesur,  proteinur,  urobilinogen,  nitrite,  leukocytesur    STUDIES: CLINICAL DATA: 48 year old female with history of right breast  cancer post lumpectomy in 2012 followed by radiation therapy.  EXAM:  DIGITAL DIAGNOSTIC BILATERAL MAMMOGRAM WITH CAD  DIGITAL BREAST TOMOSYNTHESIS  Digital breast tomosynthesis images are acquired in two projections.  These images are reviewed in combination with the digital mammogram,  confirming the findings below.  COMPARISON: Previous exams.  ACR Breast Density Category c: The breasts are heterogeneously  dense, which may obscure small masses.  FINDINGS:  No suspicious masses or calcifications are seen in either breast.  Lumpectomy changes are present in the right breast. Spot compression  magnification view of the lumpectomy site was performed. There is no  mammographic evidence of locally recurrent malignancy.  Mammographic images were processed with CAD.  IMPRESSION:  No mammographic evidence of malignancy in either breast.  RECOMMENDATION:  Bilateral diagnostic mammography in 1 year.  I have discussed the findings and recommendations with the patient.  Results were also provided in writing at the conclusion of the  visit. If applicable, a reminder letter will be sent to the patient  regarding the next appointment.  BI-RADS CATEGORY 2: Benign Finding(s)  Electronically Signed  By: Everlean Alstrom M.D.  On: 11/22/2012 09:38   ASSESSMENT: 48 y.o. [place] woman status post right lumpectomy June 2012 for a 1 cm, grade 2 ductal carcinoma in situ, strongly estrogen and progesterone receptor positive, status post radiation completed at the end of August 2012, with tamoxifen started early September  2012.   PLAN: Shanieka is doing fine as far as her noninvasive breast cancer is concerned, and the plan is to continue tamoxifen for total of 5 years. She appears to be entering perienopause and we talked about the mood changes, weight gain, insomnia, hot flashes, and other common symptoms that may develop. She's doing a terrific job by exercising several times a week. I wrote her a prescription for tamoxifen that she can take to [facility], as she seems to be paying way more than she showed for this generic drug at her local pharmacy.  She knows to call for any problems that may develop before her next visit here.  Kaeleen Odom C    01/16/2013

## 2013-01-17 NOTE — Addendum Note (Signed)
Addended by: Laureen Abrahams on: 01/17/2013 06:16 PM   Modules accepted: Orders, Medications

## 2013-06-23 ENCOUNTER — Other Ambulatory Visit: Payer: Self-pay | Admitting: Oncology

## 2013-07-29 ENCOUNTER — Encounter (INDEPENDENT_AMBULATORY_CARE_PROVIDER_SITE_OTHER): Payer: Self-pay | Admitting: General Surgery

## 2013-11-03 ENCOUNTER — Other Ambulatory Visit: Payer: Self-pay | Admitting: Oncology

## 2013-11-03 DIAGNOSIS — Z853 Personal history of malignant neoplasm of breast: Secondary | ICD-10-CM

## 2013-11-27 ENCOUNTER — Ambulatory Visit
Admission: RE | Admit: 2013-11-27 | Discharge: 2013-11-27 | Disposition: A | Discharge status: Home / Self Care | Payer: BC Managed Care – PPO | Source: Ambulatory Visit | Attending: Oncology | Admitting: Oncology

## 2013-11-27 DIAGNOSIS — Z853 Personal history of malignant neoplasm of breast: Secondary | ICD-10-CM

## 2014-01-12 ENCOUNTER — Other Ambulatory Visit (HOSPITAL_BASED_OUTPATIENT_CLINIC_OR_DEPARTMENT_OTHER): Payer: BC Managed Care – PPO

## 2014-01-12 DIAGNOSIS — D0511 Intraductal carcinoma in situ of right breast: Secondary | ICD-10-CM

## 2014-01-12 DIAGNOSIS — D0590 Unspecified type of carcinoma in situ of unspecified breast: Secondary | ICD-10-CM

## 2014-01-12 LAB — COMPREHENSIVE METABOLIC PANEL (CC13)
ALK PHOS: 51 U/L (ref 40–150)
ALT: 17 U/L (ref 0–55)
AST: 22 U/L (ref 5–34)
Albumin: 3.8 g/dL (ref 3.5–5.0)
Anion Gap: 7 mEq/L (ref 3–11)
BUN: 15.7 mg/dL (ref 7.0–26.0)
CO2: 28 meq/L (ref 22–29)
Calcium: 9.2 mg/dL (ref 8.4–10.4)
Chloride: 103 mEq/L (ref 98–109)
Creatinine: 0.7 mg/dL (ref 0.6–1.1)
EGFR: >90 mL/min/{1.73_m2} (ref >90)
Glucose: 94 mg/dl (ref 70–140)
POTASSIUM: 4.2 meq/L (ref 3.5–5.1)
Sodium: 138 mEq/L (ref 136–145)
TOTAL PROTEIN: 6.9 g/dL (ref 6.4–8.3)
Total Bilirubin: 0.69 mg/dL (ref 0.20–1.20)

## 2014-01-12 LAB — CBC WITH DIFFERENTIAL/PLATELET
BASO%: 0.2 % (ref 0.0–2.0)
Basophils Absolute: 0 10*3/uL (ref 0.0–0.1)
EOS%: 3.4 % (ref 0.0–7.0)
Eosinophils Absolute: 0.2 10*3/uL (ref 0.0–0.5)
HCT: 39 % (ref 34.8–46.6)
HGB: 12.7 g/dL (ref 11.6–15.9)
LYMPH%: 26.2 % (ref 14.0–49.7)
MCH: 29.7 pg (ref 25.1–34.0)
MCHC: 32.6 g/dL (ref 31.5–36.0)
MCV: 91.1 fL (ref 79.5–101.0)
MONO#: 0.4 10*3/uL (ref 0.1–0.9)
MONO%: 8.2 % (ref 0.0–14.0)
NEUT%: 62 % (ref 38.4–76.8)
NEUTROS ABS: 2.9 10*3/uL (ref 1.5–6.5)
PLATELETS: 150 10*3/uL (ref 145–400)
RBC: 4.28 10*6/uL (ref 3.70–5.45)
RDW: 12.8 % (ref 11.2–14.5)
WBC: 4.7 10*3/uL (ref 3.9–10.3)
lymph#: 1.2 10*3/uL (ref 0.9–3.3)

## 2014-01-20 ENCOUNTER — Ambulatory Visit (HOSPITAL_BASED_OUTPATIENT_CLINIC_OR_DEPARTMENT_OTHER): Payer: BLUE CROSS/BLUE SHIELD | Admitting: Nurse Practitioner

## 2014-01-20 ENCOUNTER — Encounter: Payer: Self-pay | Admitting: Nurse Practitioner

## 2014-01-20 ENCOUNTER — Telehealth: Payer: Self-pay | Admitting: Nurse Practitioner

## 2014-01-20 VITALS — BP 109/67 | HR 64 | Temp 97.6°F | Resp 18 | Wt 142.4 lb

## 2014-01-20 DIAGNOSIS — D0591 Unspecified type of carcinoma in situ of right breast: Secondary | ICD-10-CM

## 2014-01-20 DIAGNOSIS — D0511 Intraductal carcinoma in situ of right breast: Secondary | ICD-10-CM

## 2014-01-20 NOTE — Telephone Encounter (Signed)
per pof to sch pt appt-gave pt copy of sch °

## 2014-01-20 NOTE — Progress Notes (Signed)
ID: [name] [name]   DOB: 05/04/1965  MR#: [id number]  NLZ#:[id number]  PCP: Marvene Staff, MD GYN: SURolm Bookbinder OTHER MD:  CHIEF COMPLAINT: HX right breast cancer CURRENT TREATMENT: tamoxifen daily  BREAST CANCER HISTORY:  Morghan had routine screening mammography November 2011 showing some calcifications in the right breast and a possible mass.  Diagnostic mammography November 17 showed normal appearing parenchyma at the location of the possible mass.  There was no change there and no true mass.  There were some loosely clustered faint calcifications in the medial portion of the breast, however.  These were felt likely to be benign and associated with fibrocystic change, but 49-month follow-up was suggested.   This was performed on May 4 when the patient had a diagnostic right mammogram, and this showed additional calcifications which also appeared more pleomorphic than on the prior exam.  Accordingly the patient proceeded directly to biopsy that same day and this showed ([id number]) ductal carcinoma in situ, intermediate grade.  The tumor was 98% estrogen and 98% progesterone receptor positive.   With this information, the patient was referred to Dr. Donne Hazel and bilateral breast MRI was obtained May 11.  This showed only post biopsy changes in the medial right breast, otherwise really no MR evidence of malignancy in either breast. On May 10 the patient had diagnostic mammogram of the left breast.  This showed some suspicious calcifications in the lower left breast, however, biopsy of this area May 20, 2010 ([id number]) showed only fibrocystic changes with no atypia or malignancy.   Accordingly on June 24, 2010 the patient underwent right wire guided lumpectomy.  The pathology from this procedure ([id number]) showed an intermediate grade ductal carcinoma in situ measuring 1 cm.  All the margins were clear, the closest being 2 mm. Her subsequent history is as detailed  below.  INTERVAL HISTORY: [name] [name] returns today for follow up of her breast cancer. She has been on tamoxifen since September 2012 and is tolerating it well. She has some mild hot flashes at night and even milder vaginal dryness. She denies arthralgias or myalgias. The interval history is unremarkable. She continue to work out about 4 days weekly at her local gym. Her periods are still irregular, occuring about every other month, and lasting about 1.5-2 days with a moderate flow. Her last period was 12/13/13 and she anticipates not having one this month.   REVIEW OF SYSTEMS: [name] [name] denies fevers, chills, nausea, vomiting, or changes in bowel or bladder habits. She has no shortness of breath, chest pain, cough, palpitations, or fatigue. She has started to need reading glasses to see in dim lighting. She denies headaches, dizziness, unexplained weight loss, bruising, or bleeding. She is eating and drinking well. A detailed review of systems is otherwise negative.   PAST MEDICAL HISTORY: Past Medical History  Diagnosis Date  . Breast cancer     stage 0 breast cancer s/p lumpectomy  . History of radiation therapy 07/27/10-09/09/10    right breast 61GYtotal/39fx    PAST SURGICAL HISTORY: Past Surgical History  Procedure Laterality Date  . Cesarean section      1999/2002  . Breast lumpectomy      right    FAMILY HISTORY The patient's parents are alive, in their 80's.  She has no brothers.  She has 1 sister There is no breast or ovarian or indeed any cancer in the immediate family   GYNECOLOGIC HISTORY: She is G X, P 2, menarche age 41, first pregnancy to term  age 52. She is still menstruating, although perhaps a little bit more irregularly, and her periods are scant. 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 Her job involves a good deal of traveling. Her husband of 17 years Richardson Landry used to be in Proofreader ([place]).   They have 2 sons, age 82 and 46.  They attend [employer].   ADVANCED DIRECTIVES: in place  HEALTH MAINTENANCE: History  Substance Use Topics  . Smoking status: Never Smoker   . Smokeless tobacco: Never Used  . Alcohol Use: Yes     Colonoscopy:  PAP: UTD/ [name]  Bone density:  Lipid panel:  No Known Allergies  Current Outpatient Prescriptions  Medication Sig Dispense Refill  . calcium carbonate (OS-CAL - DOSED IN MG OF ELEMENTAL CALCIUM) 1250 MG tablet Take 1 tablet by mouth. With vitamin d    . tamoxifen (NOLVADEX) 20 MG tablet TAKE 1 TABLET BY MOUTH EVERY DAY 90 tablet 0   No current facility-administered medications for this visit.    OBJECTIVE: Middle-aged white woman in no acute distress, Filed Vitals:   01/20/14 0919  BP: 109/67  Pulse: 64  Temp: 97.6 F (36.4 C)  Resp: 18     Body mass index is 24.08 kg/(m^2).    ECOG FS: 0  Skin: warm, dry  HEENT: sclerae anicteric, conjunctivae pink, oropharynx clear. No thrush or mucositis.  Lymph Nodes: No cervical or supraclavicular lymphadenopathy  Lungs: clear to auscultation bilaterally, no rales, wheezes, or rhonci  Heart: regular rate and rhythm  Abdomen: round, soft, non tender, positive bowel sounds  Musculoskeletal: No focal spinal tenderness, no peripheral edema  Neuro: non focal, well oriented, positive affect  Breasts: right breast status post lumpectomy. No skin or nipple changes. No evidence of recurrent disease. Right axilla benign. Left breast unremarkable.   LAB RESULTS: Lab Results  Component Value Date   WBC 4.7 01/12/2014   NEUTROABS 2.9 01/12/2014   HGB 12.7 01/12/2014   HCT 39.0 01/12/2014   MCV 91.1 01/12/2014   PLT 150 01/12/2014      Chemistry      Component Value Date/Time   NA 138 01/12/2014 0855   NA 138 11/03/2012 0050   K 4.2 01/12/2014 0855   K 3.8 11/03/2012 0050   CL 101 11/03/2012 0050   CO2 28 01/12/2014 0855   CO2 27 06/29/2010 1606   BUN 15.7 01/12/2014  0855   BUN 4* 11/03/2012 0050   CREATININE 0.7 01/12/2014 0855   CREATININE 0.80 11/03/2012 0050      Component Value Date/Time   CALCIUM 9.2 01/12/2014 0855   CALCIUM 9.5 06/29/2010 1606   ALKPHOS 51 01/12/2014 0855   ALKPHOS 65 06/29/2010 1606   AST 22 01/12/2014 0855   AST 19 06/29/2010 1606   ALT 17 01/12/2014 0855   ALT 13 06/29/2010 1606   BILITOT 0.69 01/12/2014 0855   BILITOT 0.3 06/29/2010 1606       Lab Results  Component Value Date   LABCA2 22 06/17/2010    No components found for: [id number]  No results for input(s): INR in the last 168 hours.  Urinalysis No results found for: COLORURINE  STUDIES: Most recent mammogram on 11/27/13 was unremarkable.   ASSESSMENT: 49 y.o. [place] woman status post right lumpectomy June 2012 for a 1 cm, grade 2 ductal carcinoma in situ, strongly estrogen and progesterone receptor positive, status post radiation completed at the end of  August 2012, with tamoxifen started early September 2012.   PLAN:  [name] [name] continues to do well as far as her breast cancer is concerned. She is now 3.5 years out from her definitive surgery with no evidence of recurrent disease. The labs were reviewed in detail and were entirely stable. She is tolerating the tamoxifen well and will continue this drug for at least 5 years of antiestrogen therapy.   [name] [name] will return in 1 year for labs and a follow up visit. She understands and agrees with this plan. She knows the goal of treatment in her case is cure. She has been encouraged to call with any issues that might arise before her next visit here.  Marcelino Duster    01/20/2014

## 2014-01-20 NOTE — Addendum Note (Signed)
Addended by: Marcelino Duster on: 01/20/2014 09:56 AM   Modules accepted: Orders

## 2014-01-23 ENCOUNTER — Telehealth: Payer: Self-pay | Admitting: *Deleted

## 2014-01-23 ENCOUNTER — Other Ambulatory Visit: Payer: Self-pay | Admitting: *Deleted

## 2014-01-23 MED ORDER — TAMOXIFEN CITRATE 20 MG PO TABS: 20.0000 mg | ORAL_TABLET | Freq: Every day | ORAL | Status: DC

## 2014-01-23 NOTE — Telephone Encounter (Signed)
Sent Rx to pharmacy for Tamoxifen. Disp- 90, R-4. I informed pt she can pick up today.Message to be forwarded to [facility].

## 2014-04-01 ENCOUNTER — Other Ambulatory Visit: Payer: Self-pay | Admitting: Oncology

## 2014-10-21 ENCOUNTER — Ambulatory Visit: Payer: Self-pay | Admitting: Allergy and Immunology

## 2014-10-30 ENCOUNTER — Other Ambulatory Visit: Payer: Self-pay | Admitting: Oncology

## 2014-10-30 ENCOUNTER — Other Ambulatory Visit: Payer: Self-pay

## 2014-10-30 DIAGNOSIS — Z9889 Other specified postprocedural states: Secondary | ICD-10-CM

## 2014-10-30 DIAGNOSIS — Z853 Personal history of malignant neoplasm of breast: Secondary | ICD-10-CM

## 2014-12-01 ENCOUNTER — Ambulatory Visit
Admission: RE | Admit: 2014-12-01 | Discharge: 2014-12-01 | Disposition: A | Discharge status: Home / Self Care | Payer: BLUE CROSS/BLUE SHIELD | Source: Ambulatory Visit | Attending: Oncology | Admitting: Oncology

## 2014-12-01 DIAGNOSIS — Z9889 Other specified postprocedural states: Secondary | ICD-10-CM

## 2014-12-01 DIAGNOSIS — Z853 Personal history of malignant neoplasm of breast: Secondary | ICD-10-CM

## 2015-01-12 ENCOUNTER — Telehealth: Payer: Self-pay | Admitting: Oncology

## 2015-01-12 NOTE — Telephone Encounter (Signed)
Patient called and r/s 1/10 lab to 1/31 and 1/17 GM to 2/7. Patient has both new dates/times.

## 2015-01-19 ENCOUNTER — Other Ambulatory Visit: Payer: BLUE CROSS/BLUE SHIELD

## 2015-01-26 ENCOUNTER — Ambulatory Visit: Payer: BLUE CROSS/BLUE SHIELD | Admitting: Oncology

## 2015-02-09 ENCOUNTER — Other Ambulatory Visit (HOSPITAL_BASED_OUTPATIENT_CLINIC_OR_DEPARTMENT_OTHER): Payer: BLUE CROSS/BLUE SHIELD

## 2015-02-09 DIAGNOSIS — D0591 Unspecified type of carcinoma in situ of right breast: Secondary | ICD-10-CM | POA: Diagnosis not present

## 2015-02-09 LAB — COMPREHENSIVE METABOLIC PANEL
ALBUMIN: 3.8 g/dL (ref 3.5–5.0)
ALT: 17 U/L (ref 0–55)
AST: 20 U/L (ref 5–34)
Alkaline Phosphatase: 51 U/L (ref 40–150)
Anion Gap: 10 mEq/L (ref 3–11)
BILIRUBIN TOTAL: 0.51 mg/dL (ref 0.20–1.20)
BUN: 13.8 mg/dL (ref 7.0–26.0)
CALCIUM: 8.9 mg/dL (ref 8.4–10.4)
CHLORIDE: 106 meq/L (ref 98–109)
CO2: 24 mEq/L (ref 22–29)
Creatinine: 0.8 mg/dL (ref 0.6–1.1)
EGFR: 85 mL/min/{1.73_m2} — ABNORMAL LOW (ref >90)
Glucose: 90 mg/dl (ref 70–140)
Potassium: 4.2 mEq/L (ref 3.5–5.1)
Sodium: 140 mEq/L (ref 136–145)
TOTAL PROTEIN: 7 g/dL (ref 6.4–8.3)

## 2015-02-09 LAB — CBC WITH DIFFERENTIAL/PLATELET
BASO%: 0.3 % (ref 0.0–2.0)
Basophils Absolute: 0 10*3/uL (ref 0.0–0.1)
EOS ABS: 0 10*3/uL (ref 0.0–0.5)
EOS%: 1 % (ref 0.0–7.0)
HCT: 38.3 % (ref 34.8–46.6)
HEMOGLOBIN: 12.6 g/dL (ref 11.6–15.9)
LYMPH%: 25.6 % (ref 14.0–49.7)
MCH: 29.6 pg (ref 25.1–34.0)
MCHC: 32.9 g/dL (ref 31.5–36.0)
MCV: 89.9 fL (ref 79.5–101.0)
MONO#: 0.4 10*3/uL (ref 0.1–0.9)
MONO%: 11.1 % (ref 0.0–14.0)
NEUT%: 62 % (ref 38.4–76.8)
NEUTROS ABS: 2.4 10*3/uL (ref 1.5–6.5)
Platelets: 139 10*3/uL — ABNORMAL LOW (ref 145–400)
RBC: 4.26 10*6/uL (ref 3.70–5.45)
RDW: 12.7 % (ref 11.2–14.5)
WBC: 3.9 10*3/uL (ref 3.9–10.3)
lymph#: 1 10*3/uL (ref 0.9–3.3)

## 2015-02-16 ENCOUNTER — Ambulatory Visit (HOSPITAL_BASED_OUTPATIENT_CLINIC_OR_DEPARTMENT_OTHER): Payer: BLUE CROSS/BLUE SHIELD | Admitting: Oncology

## 2015-02-16 VITALS — BP 108/75 | HR 70 | Temp 97.5°F | Resp 18 | Ht 64.5 in | Wt 137.9 lb

## 2015-02-16 DIAGNOSIS — D0511 Intraductal carcinoma in situ of right breast: Secondary | ICD-10-CM

## 2015-02-16 DIAGNOSIS — G4700 Insomnia, unspecified: Secondary | ICD-10-CM

## 2015-02-16 DIAGNOSIS — C50811 Malignant neoplasm of overlapping sites of right female breast: Secondary | ICD-10-CM

## 2015-02-16 MED ORDER — TAMOXIFEN CITRATE 20 MG PO TABS: 20.0000 mg | ORAL_TABLET | Freq: Every day | ORAL | Status: DC

## 2015-02-16 NOTE — Progress Notes (Signed)
ID: [name] [name]   DOB: 1965-08-03  MR#: [id number]  NID#:[id number]  PCP: [name] Staff, MD GYN: SU: [name] [name] OTHER MD:  CHIEF COMPLAINT: HX right breast cancer  CURRENT TREATMENT: completing 5 years of tamoxifen  BREAST CANCER HISTORY:  From the original intake note:  [name] [name] had routine screening mammography November 2011 showing some calcifications in the right breast and a possible mass.  Diagnostic mammography November 17 showed normal appearing parenchyma at the location of the possible mass.  There was no change there and no true mass.  There were some loosely clustered faint calcifications in the medial portion of the breast, however.  These were felt likely to be benign and associated with fibrocystic change, but 72-monthfollow-up was suggested.   This was performed on May 4 when the patient had a diagnostic right mammogram, and this showed additional calcifications which also appeared more pleomorphic than on the prior exam.  Accordingly the patient proceeded directly to biopsy that same day and this showed (([id number] ductal carcinoma in situ, intermediate grade.  The tumor was 98% estrogen and 98% progesterone receptor positive.   With this information, the patient was referred to Dr. WDonne [name] bilateral breast MRI was obtained May 11.  This showed only post biopsy changes in the medial right breast, otherwise really no MR evidence of malignancy in either breast. On May 10 the patient had diagnostic mammogram of the left breast.  This showed some suspicious calcifications in the lower left breast, however, biopsy of this area May 20, 2010 ([id number]) showed only fibrocystic changes with no atypia or malignancy.   Accordingly on June 24, 2010 the patient underwent right wire guided lumpectomy.  The pathology from this procedure (([id number] showed an intermediate grade ductal carcinoma in situ measuring 1 cm.  All the margins were clear, the closest being  2 mm.   Her subsequent history is as detailed below.  INTERVAL HISTORY: SKaitylnreturns today for follow up of her noninvasive breast cancer. She continues on tamoxifen, with good tolerance. Hot flashes and vaginal discharge are not major issues. She obtains it at a very good price.  REVIEW OF SYSTEMS: SSomaliahas had some pain at the base of both big toes. She does not have a corresponding pain at the base of her thumbs. She also has a little bit of neck discomfort, particularly with rotation to the left. This does not radiate down the shoulder. She is having mild insomnia, getting up at least once a night. She is needing reading glasses. Aside from these issues a detailed review of systems today was noncontributory.   PAST MEDICAL HISTORY: Past Medical History  Diagnosis Date  . Breast cancer     stage 0 breast cancer s/p lumpectomy  . History of radiation therapy 07/27/10-09/09/10    right breast 61GYtotal/89f   PAST SURGICAL HISTORY: Past Surgical History  Procedure Laterality Date  . Cesarean section      1999/2002  . Breast lumpectomy      right    FAMILY HISTORY The patient's parents are alive, in their 7038's She has no brothers.  She has 1 sister There is no breast or ovarian or indeed any cancer in the immediate family   GYNECOLOGIC HISTORY: She is G X, P 2, menarche age [age]first pregnancy to term age 89She is still menstruating, although perhaps a little bit more irregularly, and her periods are scant. 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 Her job involves a good deal of traveling. Her husband [name] [name] used to be in Proofreader ([place]).  They have 2 sons, age 91 and 66.  They attend [name] [name].   ADVANCED DIRECTIVES: in place  HEALTH MAINTENANCE: Social History  Substance Use Topics  . Smoking status: Never Smoker   . Smokeless tobacco: Never Used  . Alcohol Use: Yes      Colonoscopy:  PAP: UTD/ [name] [name]  Bone density:  Lipid panel:  No Known Allergies  Current Outpatient Prescriptions  Medication Sig Dispense Refill  . calcium carbonate ([name] [name] - DOSED IN MG OF ELEMENTAL CALCIUM) 1250 MG tablet Take 1 tablet by mouth. With vitamin d    . tamoxifen ([name] [name]) 20 MG tablet Take 1 tablet (20 mg total) by mouth daily. 90 tablet 0   No current facility-administered medications for this visit.    OBJECTIVE: Middle-aged white woman who appears well Filed Vitals:   02/16/15 1517  BP: 108/75  Pulse: 70  Temp: 97.5 F (36.4 C)  Resp: 18     Body mass index is 23.31 kg/(m^2).    ECOG FS: 0  Sclerae unicteric, EOMs intact Oropharynx clear, dentition in good repair No cervical or supraclavicular adenopathy Lungs no rales or rhonchi Heart regular rate and rhythm Abd soft, nontender, positive bowel sounds MSK no focal spinal tenderness, no upper extremity lymphedema Neuro: nonfocal, well oriented, appropriate affect Breasts: The right breast is status post lumpectomy and radiation. There is no evidence of local recurrence. The right axilla is benign. The left breast is unremarkable. Marland Kitchen   LAB RESULTS: Lab Results  Component Value Date   WBC 3.9 02/09/2015   NEUTROABS 2.4 02/09/2015   HGB 12.6 02/09/2015   HCT 38.3 02/09/2015   MCV 89.9 02/09/2015   PLT 139* 02/09/2015      Chemistry      Component Value Date/Time   NA 140 02/09/2015 1116   NA 138 11/03/2012 0050   K 4.2 02/09/2015 1116   K 3.8 11/03/2012 0050   CL 101 11/03/2012 0050   CO2 24 02/09/2015 1116   CO2 27 06/29/2010 1606   BUN 13.8 02/09/2015 1116   BUN 4* 11/03/2012 0050   CREATININE 0.8 02/09/2015 1116   CREATININE 0.80 11/03/2012 0050      Component Value Date/Time   CALCIUM 8.9 02/09/2015 1116   CALCIUM 9.5 06/29/2010 1606   ALKPHOS 51 02/09/2015 1116   ALKPHOS 65 06/29/2010 1606   AST 20 02/09/2015 1116   AST 19 06/29/2010 1606   ALT 17 02/09/2015 1116   ALT  13 06/29/2010 1606   BILITOT 0.51 02/09/2015 1116   BILITOT 0.3 06/29/2010 1606       Lab Results  Component Value Date   LABCA2 22 06/17/2010    No components found for: [id number]  No results for input(s): INR in the last 168 hours.  Urinalysis No results found for: COLORURINE  STUDIES: CLINICAL DATA: Routine postlumpectomy diagnostic mammogram. The patient has had a right breast lumpectomy in June of 2012 followed by radiation therapy.  EXAM: DIGITAL DIAGNOSTIC BILATERAL MAMMOGRAM WITH 3D TOMOSYNTHESIS AND CAD  COMPARISON: Previous exam(s).  ACR Breast Density Category c: The breast tissue is heterogeneously dense, which may obscure small masses.  FINDINGS: The lumpectomy site in the medial right breast is stable. No suspicious calcifications, masses or areas of distortion are seen in the bilateral breasts.  Mammographic images were processed with CAD.  IMPRESSION: Stable  right breast lumpectomy site. No evidence of malignancy in the bilateral breasts.  RECOMMENDATION: Diagnostic mammogram is suggested in 1 year. (Code:DM-B-01Y)  I have discussed the findings and recommendations with the patient. Results were also provided in writing at the conclusion of the visit. If applicable, a reminder letter will be sent to the patient regarding the next appointment.  BI-RADS CATEGORY 2: Benign.   Electronically Signed  By: Ammie Ferrier M.D.  On: 12/01/2014 10:23   ASSESSMENT: 50 y.o. BRCA negative [place] woman status post right lumpectomy June 2012 for a 1 cm, grade 2 ductal carcinoma in situ, strongly estrogen and progesterone receptor positive, status post radiation completed at the end of August 2012, on tamoxifen as of September 2012.   PLAN:  [name] [name] will complete her 5 years of tamoxifen this fall. I reviewed her situation so she recalls her cancer was noninvasive therefore not life threatening as the cancer cells were trapped in  the ducts were they started and could not travel to a vital organ elsewhere.  In some patients with invasive disease we can I continue tamoxifen for 10 years, but we have no similar data for noninvasive disease and I'm very comfortable with her completing her 5 years of tamoxifen this September and then stopping.  She is beginning to develop some arthritis symptoms. I suggested yoga and/or tai chi in addition to her excellent exercise program otherwise.  She is comfortable "graduating" from follow-up here. She understands as far as breast cancer screening is concerned all she will need is yearly mammography and a yearly physician breast exam.  I will be glad to see [name] [name] again at any point in the future as needed, but as of now were making no further routine follow-up appointments for her here. MAGRINAT,GUSTAV C    02/16/2015

## 2015-03-01 ENCOUNTER — Ambulatory Visit (INDEPENDENT_AMBULATORY_CARE_PROVIDER_SITE_OTHER): Payer: BLUE CROSS/BLUE SHIELD | Admitting: Internal Medicine

## 2015-03-01 ENCOUNTER — Encounter: Payer: Self-pay | Admitting: Internal Medicine

## 2015-03-01 VITALS — BP 108/78 | HR 72 | Temp 97.9°F | Resp 18 | Ht 65.0 in | Wt 139.0 lb

## 2015-03-01 DIAGNOSIS — Z853 Personal history of malignant neoplasm of breast: Secondary | ICD-10-CM | POA: Diagnosis not present

## 2015-03-01 DIAGNOSIS — Z1321 Encounter for screening for nutritional disorder: Secondary | ICD-10-CM | POA: Diagnosis not present

## 2015-03-01 DIAGNOSIS — Z1329 Encounter for screening for other suspected endocrine disorder: Secondary | ICD-10-CM | POA: Diagnosis not present

## 2015-03-01 DIAGNOSIS — M542 Cervicalgia: Secondary | ICD-10-CM

## 2015-03-01 DIAGNOSIS — Z789 Other specified health status: Secondary | ICD-10-CM

## 2015-03-01 DIAGNOSIS — Z13220 Encounter for screening for lipoid disorders: Secondary | ICD-10-CM

## 2015-03-01 DIAGNOSIS — D0511 Intraductal carcinoma in situ of right breast: Secondary | ICD-10-CM | POA: Diagnosis not present

## 2015-03-01 DIAGNOSIS — Z0000 Encounter for general adult medical examination without abnormal findings: Secondary | ICD-10-CM | POA: Diagnosis not present

## 2015-03-01 LAB — LIPID PANEL
CHOL/HDL RATIO: 1.9 ratio (ref <=5.0)
Cholesterol: 162 mg/dL (ref 125–200)
HDL: 86 mg/dL (ref >=46)
LDL Cholesterol: 68 mg/dL (ref <130)
TRIGLYCERIDES: 40 mg/dL (ref <150)
VLDL: 8 mg/dL (ref <30)

## 2015-03-01 MED ORDER — CYCLOBENZAPRINE HCL 10 MG PO TABS: 10.0000 mg | ORAL_TABLET | Freq: Three times a day (TID) | ORAL | Status: DC | PRN

## 2015-03-01 NOTE — Progress Notes (Signed)
Subjective:    Patient ID: [name] [name], female    DOB: 1965-11-09, 51 y.o.   MRN: [id number]  HPI 50 year old White Female for health maintenance and evaluation of medical problems. Patient was last seen here in 2007. Patient had routine screening mammography November 2011 showing some calcifications of the right breast and possible mass. Diagnostic mammography subsequently showed normal-appearing parenchyma at the location of the possible mass. Six-month follow-up was recommended. This was done May 2012. She had a diagnostic right mammogram showing additional calcifications. She had a biopsy showing a ductal carcinoma in situ intermediate grade which was 98% estrogen receptor positive and 98% progesterone receptor positive. Diagnostic mammogram of left breast showed suspicious calcifications in the left lower breast but biopsy showed this to be only fibrocystic changes. She had lumpectomy June 2012 to remove lesion from right breast. All margins were clear. She was started on tamoxifen.  History of C- sections 1999 and 2002. She took oral contraceptives for several years. Had menarche at age 41. G2 P2. Dr. cousins is GYN physician. She takes calcium and vitamin D supplement.  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  Nonsmoker. Social alcohol consumption. Has college degree. 2 sons ages 26 and 82. Husband is self-employed. Family history: Sr. in good health. Father 67 in good health. Mother age 54 who had recent colon surgery apparently for diverticulitis. Wound has been slow to heal.    Review of Systems says tamoxifen is cause some joint pain in her right MCP joint and foot. Complaining of left neck pain     Objective:   Physical Exam  Constitutional: She is oriented to person, place, and time. She appears well-developed and well-nourished. No distress.  HENT:  Head: Normocephalic and atraumatic.  Right Ear: External ear normal.  Left Ear: External ear  normal.  Mouth/Throat: Oropharynx is clear and moist. No oropharyngeal exudate.  Eyes: Conjunctivae and EOM are normal. Pupils are equal, round, and reactive to light. Right eye exhibits no discharge. Left eye exhibits no discharge. No scleral icterus.  Neck: Neck supple. No JVD present. No thyromegaly present.  Cardiovascular: Normal rate, regular rhythm, normal heart sounds and intact distal pulses.   No murmur heard. Pulmonary/Chest: Effort normal and breath sounds normal. No respiratory distress. She has no wheezes. She has no rales.  Breasts normal female without masses  Abdominal: She exhibits no distension. There is no tenderness. There is no rebound and no guarding.  Genitourinary:  Deferred to GYN  Musculoskeletal: She exhibits no edema.  Palpable spasm left SCM muscle  Lymphadenopathy:    She has no cervical adenopathy.  Neurological: She is alert and oriented to person, place, and time. She has normal reflexes.  Skin: Skin is warm and dry. No rash noted. She is not diaphoretic.  Psychiatric: She has a normal mood and affect. Her behavior is normal. Judgment and thought content normal.  Vitals reviewed.         Assessment & Plan:  History of right breast cancer-followed by Dr. Jana Hakim since 2012  Left neck pain-musculoskeletal. Recommend ice 20 minutes once or twice daily and Flexeril  Plan: Patient has CBC and see me at January 2017 with slightly low platelet count of 139,000 otherwise normal. She had TSH, lipid panel and vitamin D level drawn today which are pending. She has form that needs to be completed for insurance purposes through employment including nicotine screening.  Patient should return in one year or as needed.  Have prescribed Flexeril for neck pain. If neck pain does not improve, she may need physical therapy. Bone density study at age 59. Colonoscopy at age 46.

## 2015-03-01 NOTE — Patient Instructions (Signed)
Flexeril for neck pain. Call if not improving. RTC one year or prn. Colonoscopy after November. It was a pleasure to see you. Bone density at age 50.

## 2015-03-02 LAB — NICOTINE/COTININE METABOLITES: Cotinine: <10 ng/mL

## 2015-03-02 LAB — VITAMIN D 25 HYDROXY (VIT D DEFICIENCY, FRACTURES): Vit D, 25-Hydroxy: 44 ng/mL (ref 30–100)

## 2015-03-02 LAB — TSH: TSH: 2.87 mIU/L

## 2015-04-01 ENCOUNTER — Other Ambulatory Visit: Payer: Self-pay | Admitting: Nurse Practitioner

## 2015-06-04 DIAGNOSIS — L570 Actinic keratosis: Secondary | ICD-10-CM | POA: Diagnosis not present

## 2015-06-04 DIAGNOSIS — L820 Inflamed seborrheic keratosis: Secondary | ICD-10-CM | POA: Diagnosis not present

## 2015-08-27 DIAGNOSIS — L255 Unspecified contact dermatitis due to plants, except food: Secondary | ICD-10-CM | POA: Diagnosis not present

## 2015-08-31 DIAGNOSIS — L237 Allergic contact dermatitis due to plants, except food: Secondary | ICD-10-CM | POA: Diagnosis not present

## 2015-08-31 DIAGNOSIS — L282 Other prurigo: Secondary | ICD-10-CM | POA: Diagnosis not present

## 2015-10-22 DIAGNOSIS — D2261 Melanocytic nevi of right upper limb, including shoulder: Secondary | ICD-10-CM | POA: Diagnosis not present

## 2015-10-22 DIAGNOSIS — D225 Melanocytic nevi of trunk: Secondary | ICD-10-CM | POA: Diagnosis not present

## 2015-10-22 DIAGNOSIS — L821 Other seborrheic keratosis: Secondary | ICD-10-CM | POA: Diagnosis not present

## 2015-10-22 DIAGNOSIS — D2271 Melanocytic nevi of right lower limb, including hip: Secondary | ICD-10-CM | POA: Diagnosis not present

## 2015-11-09 ENCOUNTER — Other Ambulatory Visit: Payer: Self-pay | Admitting: Obstetrics and Gynecology

## 2015-11-09 DIAGNOSIS — N6320 Unspecified lump in the left breast, unspecified quadrant: Secondary | ICD-10-CM

## 2015-11-09 DIAGNOSIS — N6342 Unspecified lump in left breast, subareolar: Secondary | ICD-10-CM | POA: Diagnosis not present

## 2015-11-12 ENCOUNTER — Ambulatory Visit
Admission: RE | Admit: 2015-11-12 | Discharge: 2015-11-12 | Disposition: A | Discharge status: Home / Self Care | Payer: BLUE CROSS/BLUE SHIELD | Source: Ambulatory Visit | Attending: Obstetrics and Gynecology | Admitting: Obstetrics and Gynecology

## 2015-11-12 DIAGNOSIS — N6320 Unspecified lump in the left breast, unspecified quadrant: Secondary | ICD-10-CM | POA: Diagnosis not present

## 2015-12-13 ENCOUNTER — Other Ambulatory Visit: Payer: Self-pay | Admitting: Obstetrics and Gynecology

## 2015-12-13 DIAGNOSIS — Z853 Personal history of malignant neoplasm of breast: Secondary | ICD-10-CM

## 2015-12-22 ENCOUNTER — Ambulatory Visit
Admission: RE | Admit: 2015-12-22 | Discharge: 2015-12-22 | Disposition: A | Discharge status: Home / Self Care | Payer: BLUE CROSS/BLUE SHIELD | Source: Ambulatory Visit | Attending: Obstetrics and Gynecology | Admitting: Obstetrics and Gynecology

## 2015-12-22 DIAGNOSIS — R922 Inconclusive mammogram: Secondary | ICD-10-CM | POA: Diagnosis not present

## 2015-12-22 DIAGNOSIS — Z853 Personal history of malignant neoplasm of breast: Secondary | ICD-10-CM

## 2016-02-11 DIAGNOSIS — Z6823 Body mass index (BMI) 23.0-23.9, adult: Secondary | ICD-10-CM | POA: Diagnosis not present

## 2016-02-11 DIAGNOSIS — Z1151 Encounter for screening for human papillomavirus (HPV): Secondary | ICD-10-CM | POA: Diagnosis not present

## 2016-02-11 DIAGNOSIS — Z01419 Encounter for gynecological examination (general) (routine) without abnormal findings: Secondary | ICD-10-CM | POA: Diagnosis not present

## 2016-02-29 ENCOUNTER — Other Ambulatory Visit: Payer: BLUE CROSS/BLUE SHIELD | Admitting: Internal Medicine

## 2016-02-29 DIAGNOSIS — Z13220 Encounter for screening for lipoid disorders: Secondary | ICD-10-CM | POA: Diagnosis not present

## 2016-02-29 DIAGNOSIS — Z1329 Encounter for screening for other suspected endocrine disorder: Secondary | ICD-10-CM

## 2016-02-29 DIAGNOSIS — Z1321 Encounter for screening for nutritional disorder: Secondary | ICD-10-CM

## 2016-02-29 DIAGNOSIS — Z0000 Encounter for general adult medical examination without abnormal findings: Secondary | ICD-10-CM | POA: Diagnosis not present

## 2016-02-29 LAB — CBC WITH DIFFERENTIAL/PLATELET
Basophils Absolute: 0 {cells}/uL (ref 0–200)
Basophils Relative: 0 %
Eosinophils Absolute: 86 {cells}/uL (ref 15–500)
Eosinophils Relative: 2 %
HCT: 37.7 % (ref 35.0–45.0)
Hemoglobin: 12.4 g/dL (ref 11.7–15.5)
Lymphocytes Relative: 33 %
Lymphs Abs: 1419 {cells}/uL (ref 850–3900)
MCH: 29.7 pg (ref 27.0–33.0)
MCHC: 32.9 g/dL (ref 32.0–36.0)
MCV: 90.2 fL (ref 80.0–100.0)
MPV: 10.6 fL (ref 7.5–12.5)
Monocytes Absolute: 430 {cells}/uL (ref 200–950)
Monocytes Relative: 10 %
Neutro Abs: 2365 {cells}/uL (ref 1500–7800)
Neutrophils Relative %: 55 %
Platelets: 187 K/uL (ref 140–400)
RBC: 4.18 MIL/uL (ref 3.80–5.10)
RDW: 13.3 % (ref 11.0–15.0)
WBC: 4.3 K/uL (ref 3.8–10.8)

## 2016-02-29 LAB — COMPREHENSIVE METABOLIC PANEL
ALT: 20 U/L (ref 6–29)
AST: 22 U/L (ref 10–35)
Albumin: 3.7 g/dL (ref 3.6–5.1)
Alkaline Phosphatase: 53 U/L (ref 33–130)
BUN: 11 mg/dL (ref 7–25)
CHLORIDE: 103 mmol/L (ref 98–110)
CO2: 27 mmol/L (ref 20–31)
CREATININE: 0.63 mg/dL (ref 0.50–1.05)
Calcium: 9.2 mg/dL (ref 8.6–10.4)
GLUCOSE: 84 mg/dL (ref 65–99)
Potassium: 4.1 mmol/L (ref 3.5–5.3)
SODIUM: 138 mmol/L (ref 135–146)
Total Bilirubin: 0.6 mg/dL (ref 0.2–1.2)
Total Protein: 6.5 g/dL (ref 6.1–8.1)

## 2016-02-29 LAB — LIPID PANEL
Cholesterol: 171 mg/dL
HDL: 69 mg/dL
LDL Cholesterol: 89 mg/dL
Total CHOL/HDL Ratio: 2.5 ratio
Triglycerides: 67 mg/dL
VLDL: 13 mg/dL

## 2016-02-29 LAB — TSH: TSH: 3.62 m[IU]/L

## 2016-03-01 LAB — VITAMIN D 25 HYDROXY (VIT D DEFICIENCY, FRACTURES): VIT D 25 HYDROXY: 46 ng/mL (ref 30–100)

## 2016-03-03 ENCOUNTER — Encounter: Payer: Self-pay | Admitting: Internal Medicine

## 2016-03-03 ENCOUNTER — Ambulatory Visit (INDEPENDENT_AMBULATORY_CARE_PROVIDER_SITE_OTHER): Payer: BLUE CROSS/BLUE SHIELD | Admitting: Internal Medicine

## 2016-03-03 VITALS — BP 110/64 | HR 78 | Temp 97.8°F | Ht 63.75 in | Wt 139.0 lb

## 2016-03-03 DIAGNOSIS — Z853 Personal history of malignant neoplasm of breast: Secondary | ICD-10-CM

## 2016-03-03 DIAGNOSIS — Z1211 Encounter for screening for malignant neoplasm of colon: Secondary | ICD-10-CM

## 2016-03-03 DIAGNOSIS — Z0000 Encounter for general adult medical examination without abnormal findings: Secondary | ICD-10-CM | POA: Diagnosis not present

## 2016-03-03 LAB — POCT URINALYSIS DIPSTICK
BILIRUBIN UA: NEGATIVE
Glucose, UA: NEGATIVE
KETONES UA: NEGATIVE
Leukocytes, UA: NEGATIVE
Nitrite, UA: NEGATIVE
PH UA: 6.5
Protein, UA: NEGATIVE
RBC UA: NEGATIVE
Spec Grav, UA: 1.015
Urobilinogen, UA: NEGATIVE

## 2016-03-03 NOTE — Progress Notes (Signed)
   Subjective:    Patient ID: [name] [name], female    DOB: 04-09-1965, 51 y.o.   MRN: [id number]  HPI 51 year old  Female for health maintenance exam.Has wellness form that needs to be completed for work.  In November 2011 she had an abnormal mammogram showing calcifications of the right breast and possible mass. Diagnostic mammography subsequently showed normal-appearing parenchyma at the location of the possible mass in 6 month follow-up was recommended. In May 2012 she had a diagnostic right mammogram showing additional calcifications. A biopsy showed ductal carcinoma in situ intermediate grade which was 98% estrogen receptor positive and 98% progesterone receptor positive. Diagnostic mammogram of the left breast showed suspicious calcifications in the left lower breast but biopsy showed this only to be fibrocystic changes. She underwent lumpectomy June 2012 to remove lesion from right breast. All margins were clear. She was started on tamoxifen.  History of serious sections 1999 in 2002. She took oral contraceptives for several years. Had menarche at age 24. G2 P2. Dr. Garwin Brothers is GYN physician and she saw her recently.  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 Social alcohol consumption. Has a college degree. 2 sons ages 10 and 51. 110 year old be attending [employer] next year to play lacrosse. Husband is self-employed.  Family history: Sister in good health. Father in good health. Mother age 50 with history of diverticulitis.    Review of Systems  Constitutional: Negative.   All other systems reviewed and are negative.      Objective:   Physical Exam  Constitutional: She is oriented to person, place, and time. She appears well-developed and well-nourished. No distress.  HENT:  Head: Normocephalic and atraumatic.  Right Ear: External ear normal.  Left Ear: External ear normal.  Mouth/Throat: Oropharynx is clear and moist.  Eyes:  Conjunctivae and EOM are normal. Pupils are equal, round, and reactive to light. Right eye exhibits no discharge.  Neck: Neck supple. No JVD present. No thyromegaly present.  Cardiovascular: Normal rate, regular rhythm, normal heart sounds and intact distal pulses.   No murmur heard. Breasts normal female  Pulmonary/Chest: Effort normal and breath sounds normal. No respiratory distress. She has no wheezes. She has no rales.  Abdominal: Soft. Bowel sounds are normal. She exhibits no distension and no mass. There is no tenderness. There is no rebound and no guarding.  Genitourinary:  Genitourinary Comments: Deferred to GYN   Musculoskeletal: She exhibits no edema.  Lymphadenopathy:    She has no cervical adenopathy.  Neurological: She is alert and oriented to person, place, and time. She has normal reflexes. No cranial nerve deficit. Coordination normal.  Skin: Skin is warm and dry. No rash noted. She is not diaphoretic.  Psychiatric: She has a normal mood and affect. Her behavior is normal. Judgment and thought content normal.  Vitals reviewed.         Assessment & Plan:  Normal health maintenance exam.  Lab work reviewed with her and is within normal limits. Colonoscopy recommended. Referral made.  Plan: Return in one year or as needed.

## 2016-03-04 NOTE — Patient Instructions (Signed)
It was pleasure to see you today. Return in one year or as needed. Have colonoscopy.

## 2016-12-08 ENCOUNTER — Other Ambulatory Visit: Payer: Self-pay | Admitting: Obstetrics and Gynecology

## 2016-12-08 DIAGNOSIS — Z853 Personal history of malignant neoplasm of breast: Secondary | ICD-10-CM

## 2016-12-22 ENCOUNTER — Ambulatory Visit
Admission: RE | Admit: 2016-12-22 | Discharge: 2016-12-22 | Disposition: A | Discharge status: Home / Self Care | Payer: BLUE CROSS/BLUE SHIELD | Source: Ambulatory Visit | Attending: Obstetrics and Gynecology | Admitting: Obstetrics and Gynecology

## 2016-12-22 DIAGNOSIS — L218 Other seborrheic dermatitis: Secondary | ICD-10-CM | POA: Diagnosis not present

## 2016-12-22 DIAGNOSIS — D2261 Melanocytic nevi of right upper limb, including shoulder: Secondary | ICD-10-CM | POA: Diagnosis not present

## 2016-12-22 DIAGNOSIS — L738 Other specified follicular disorders: Secondary | ICD-10-CM | POA: Diagnosis not present

## 2016-12-22 DIAGNOSIS — L570 Actinic keratosis: Secondary | ICD-10-CM | POA: Diagnosis not present

## 2016-12-22 DIAGNOSIS — R922 Inconclusive mammogram: Secondary | ICD-10-CM | POA: Diagnosis not present

## 2016-12-22 DIAGNOSIS — Z853 Personal history of malignant neoplasm of breast: Secondary | ICD-10-CM

## 2016-12-22 DIAGNOSIS — L820 Inflamed seborrheic keratosis: Secondary | ICD-10-CM | POA: Diagnosis not present

## 2016-12-22 HISTORY — DX: Personal history of irradiation: Z92.3

## 2017-02-14 ENCOUNTER — Other Ambulatory Visit: Payer: Self-pay

## 2017-02-14 DIAGNOSIS — Z1321 Encounter for screening for nutritional disorder: Secondary | ICD-10-CM

## 2017-02-14 DIAGNOSIS — Z13220 Encounter for screening for lipoid disorders: Secondary | ICD-10-CM

## 2017-02-14 DIAGNOSIS — Z0000 Encounter for general adult medical examination without abnormal findings: Secondary | ICD-10-CM

## 2017-02-14 DIAGNOSIS — Z1329 Encounter for screening for other suspected endocrine disorder: Secondary | ICD-10-CM

## 2017-03-12 ENCOUNTER — Other Ambulatory Visit: Payer: BLUE CROSS/BLUE SHIELD | Admitting: Internal Medicine

## 2017-03-12 DIAGNOSIS — Z0000 Encounter for general adult medical examination without abnormal findings: Secondary | ICD-10-CM | POA: Diagnosis not present

## 2017-03-12 DIAGNOSIS — Z13220 Encounter for screening for lipoid disorders: Secondary | ICD-10-CM | POA: Diagnosis not present

## 2017-03-12 DIAGNOSIS — Z1321 Encounter for screening for nutritional disorder: Secondary | ICD-10-CM | POA: Diagnosis not present

## 2017-03-12 DIAGNOSIS — Z1329 Encounter for screening for other suspected endocrine disorder: Secondary | ICD-10-CM | POA: Diagnosis not present

## 2017-03-13 LAB — COMPLETE METABOLIC PANEL WITH GFR
AG Ratio: 1.7 (calc) (ref 1.0–2.5)
ALBUMIN MSPROF: 3.8 g/dL (ref 3.6–5.1)
ALT: 12 U/L (ref 6–29)
AST: 18 U/L (ref 10–35)
Alkaline phosphatase (APISO): 55 U/L (ref 33–130)
BILIRUBIN TOTAL: 0.5 mg/dL (ref 0.2–1.2)
BUN: 12 mg/dL (ref 7–25)
CHLORIDE: 103 mmol/L (ref 98–110)
CO2: 29 mmol/L (ref 20–32)
Calcium: 9 mg/dL (ref 8.6–10.4)
Creat: 0.67 mg/dL (ref 0.50–1.05)
GFR, Est African American: 118 mL/min/{1.73_m2} (ref >=60)
GFR, Est Non African American: 102 mL/min/{1.73_m2} (ref >=60)
GLOBULIN: 2.3 g/dL (ref 1.9–3.7)
GLUCOSE: 87 mg/dL (ref 65–99)
Potassium: 3.9 mmol/L (ref 3.5–5.3)
SODIUM: 138 mmol/L (ref 135–146)
TOTAL PROTEIN: 6.1 g/dL (ref 6.1–8.1)

## 2017-03-13 LAB — CBC WITH DIFFERENTIAL/PLATELET
BASOS ABS: 18 {cells}/uL (ref 0–200)
Basophils Relative: 0.4 %
EOS PCT: 2.2 %
Eosinophils Absolute: 101 cells/uL (ref 15–500)
HEMATOCRIT: 34.9 % — AB (ref 35.0–45.0)
Hemoglobin: 11.8 g/dL (ref 11.7–15.5)
LYMPHS ABS: 1173 {cells}/uL (ref 850–3900)
MCH: 30.3 pg (ref 27.0–33.0)
MCHC: 33.8 g/dL (ref 32.0–36.0)
MCV: 89.7 fL (ref 80.0–100.0)
MPV: 11.4 fL (ref 7.5–12.5)
Monocytes Relative: 8.5 %
NEUTROS PCT: 63.4 %
Neutro Abs: 2916 cells/uL (ref 1500–7800)
Platelets: 161 10*3/uL (ref 140–400)
RBC: 3.89 10*6/uL (ref 3.80–5.10)
RDW: 11.9 % (ref 11.0–15.0)
Total Lymphocyte: 25.5 %
WBC mixed population: 391 cells/uL (ref 200–950)
WBC: 4.6 10*3/uL (ref 3.8–10.8)

## 2017-03-13 LAB — LIPID PANEL
CHOL/HDL RATIO: 2.2 (calc) (ref <5.0)
CHOLESTEROL: 158 mg/dL (ref <200)
HDL: 73 mg/dL (ref >50)
LDL CHOLESTEROL (CALC): 72 mg/dL
NON-HDL CHOLESTEROL (CALC): 85 mg/dL (ref <130)
Triglycerides: 52 mg/dL (ref <150)

## 2017-03-13 LAB — TSH: TSH: 3.86 mIU/L

## 2017-03-13 LAB — VITAMIN D 25 HYDROXY (VIT D DEFICIENCY, FRACTURES): Vit D, 25-Hydroxy: 41 ng/mL (ref 30–100)

## 2017-03-16 ENCOUNTER — Encounter: Payer: Self-pay | Admitting: Internal Medicine

## 2017-03-16 ENCOUNTER — Ambulatory Visit (INDEPENDENT_AMBULATORY_CARE_PROVIDER_SITE_OTHER): Payer: BLUE CROSS/BLUE SHIELD | Admitting: Internal Medicine

## 2017-03-16 VITALS — BP 98/70 | HR 65 | Ht 64.0 in | Wt 145.0 lb

## 2017-03-16 DIAGNOSIS — Z1211 Encounter for screening for malignant neoplasm of colon: Secondary | ICD-10-CM

## 2017-03-16 DIAGNOSIS — Z86000 Personal history of in-situ neoplasm of breast: Secondary | ICD-10-CM

## 2017-03-16 DIAGNOSIS — Z0000 Encounter for general adult medical examination without abnormal findings: Secondary | ICD-10-CM | POA: Diagnosis not present

## 2017-03-16 LAB — POCT URINALYSIS DIPSTICK
Appearance: NORMAL
Bilirubin, UA: NEGATIVE
Blood, UA: NEGATIVE
Glucose, UA: NEGATIVE
KETONES UA: NEGATIVE
Leukocytes, UA: NEGATIVE
NITRITE UA: NEGATIVE
ODOR: NORMAL
PH UA: 7 (ref 5.0–8.0)
PROTEIN UA: NEGATIVE
Spec Grav, UA: <=1.005 — AB (ref 1.010–1.025)
UROBILINOGEN UA: 0.2 U/dL

## 2017-04-07 NOTE — Progress Notes (Signed)
   Subjective:    Patient ID: [name] [name], female    DOB: Nov 21, 1965, 52 y.o.   MRN: [id number]  HPI 52 year old female in today for health maintenance exam and evaluation of medical issues.  In November 2011 she had an abnormal mammogram showing calcifications of the right breast and possible mass.  Diagnostic mammography showed normal-appearing parenchyma at the location of the possible mass in 37-month follow-up was recommended.  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    A diagnostic mammogram of the left breast showed suspicious calcifications in the left lower breast biopsy showed this only to be fibrocystic changes.  She underwent lumpectomy June 2012 to remove lesion in right breast.  All margins were clear.  She was started on tamoxifen.  History of cesarean sections 1999 and  2002.  She took oral contraceptive for several years.  Had menarche at age 37.  G2 P2.  Dr. cousins is GYN physician.  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  Non-smoker.  Social alcohol consumption.  Has college degree.  She has 2 sons ages 16 and 18.  Husband is self-employed.  Family history: Sister in good health.  Father in good health.  Mother with history of diverticulitis living in her 33s.    Review of Systems  Constitutional: Negative.   All other systems reviewed and are negative.      Objective:   Physical Exam  Constitutional: She is oriented to person, place, and time. She appears well-developed and well-nourished. No distress.  HENT:  Head: Normocephalic and atraumatic.  Right Ear: External ear normal.  Left Ear: External ear normal.  Mouth/Throat: Oropharynx is clear and moist.  Eyes: Pupils are equal, round, and reactive to light. Conjunctivae and EOM are normal. Right  eye exhibits no discharge. Left eye exhibits no discharge.  Neck: Neck supple. No JVD present. No thyromegaly present.  Cardiovascular: Normal rate, regular rhythm and normal heart sounds.  No murmur heard. Pulmonary/Chest: Effort normal and breath sounds normal. No respiratory distress. She has no wheezes. She has no rales. She exhibits no tenderness.  Breasts normal female  Abdominal: Soft. Bowel sounds are normal. She exhibits no distension and no mass. There is no tenderness. There is no rebound and no guarding.  Genitourinary:  Genitourinary Comments: Deferred to Dr. Garwin Brothers and she will be seeing her in about a month  Musculoskeletal: She exhibits no edema.  Lymphadenopathy:    She has no cervical adenopathy.  Neurological: She is alert and oriented to person, place, and time. No cranial nerve deficit. Coordination normal.  Skin: Skin is warm and dry. No rash noted. She is not diaphoretic.  Psychiatric: She has a normal mood and affect. Her behavior is normal. Judgment and thought content normal.  Vitals reviewed.         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  Lab work reviewed and is entirely within normal limits.  To see GYN physician next month.  Plan: Return in 1 year or as needed.  Needs to have screening colonoscopy.

## 2017-04-07 NOTE — Patient Instructions (Signed)
It was a pleasure to see you today.  Please have screening colonoscopy.  Return in 1 year or as needed.

## 2017-04-16 ENCOUNTER — Encounter: Payer: Self-pay | Admitting: Internal Medicine

## 2017-07-02 DIAGNOSIS — Z01419 Encounter for gynecological examination (general) (routine) without abnormal findings: Secondary | ICD-10-CM | POA: Diagnosis not present

## 2017-07-02 DIAGNOSIS — Z6824 Body mass index (BMI) 24.0-24.9, adult: Secondary | ICD-10-CM | POA: Diagnosis not present

## 2017-07-02 DIAGNOSIS — Z1151 Encounter for screening for human papillomavirus (HPV): Secondary | ICD-10-CM | POA: Diagnosis not present

## 2017-07-11 ENCOUNTER — Other Ambulatory Visit: Payer: Self-pay | Admitting: Obstetrics and Gynecology

## 2017-07-11 DIAGNOSIS — N63 Unspecified lump in unspecified breast: Secondary | ICD-10-CM

## 2017-07-16 ENCOUNTER — Ambulatory Visit
Admission: RE | Admit: 2017-07-16 | Discharge: 2017-07-16 | Disposition: A | Discharge status: Home / Self Care | Payer: BLUE CROSS/BLUE SHIELD | Source: Ambulatory Visit | Attending: Obstetrics and Gynecology | Admitting: Obstetrics and Gynecology

## 2017-07-16 DIAGNOSIS — N63 Unspecified lump in unspecified breast: Secondary | ICD-10-CM

## 2017-07-16 DIAGNOSIS — N6320 Unspecified lump in the left breast, unspecified quadrant: Secondary | ICD-10-CM | POA: Diagnosis not present

## 2017-07-16 DIAGNOSIS — R922 Inconclusive mammogram: Secondary | ICD-10-CM | POA: Diagnosis not present

## 2017-08-17 DIAGNOSIS — D225 Melanocytic nevi of trunk: Secondary | ICD-10-CM | POA: Diagnosis not present

## 2017-08-17 DIAGNOSIS — L570 Actinic keratosis: Secondary | ICD-10-CM | POA: Diagnosis not present

## 2017-08-17 DIAGNOSIS — L821 Other seborrheic keratosis: Secondary | ICD-10-CM | POA: Diagnosis not present

## 2017-08-17 DIAGNOSIS — L720 Epidermal cyst: Secondary | ICD-10-CM | POA: Diagnosis not present

## 2017-08-17 DIAGNOSIS — L738 Other specified follicular disorders: Secondary | ICD-10-CM | POA: Diagnosis not present

## 2017-08-30 ENCOUNTER — Other Ambulatory Visit: Payer: Self-pay | Admitting: Obstetrics and Gynecology

## 2017-08-30 DIAGNOSIS — Z853 Personal history of malignant neoplasm of breast: Secondary | ICD-10-CM

## 2017-08-30 DIAGNOSIS — N63 Unspecified lump in unspecified breast: Secondary | ICD-10-CM

## 2017-09-21 ENCOUNTER — Ambulatory Visit
Admission: RE | Admit: 2017-09-21 | Discharge: 2017-09-21 | Disposition: A | Discharge status: Home / Self Care | Payer: BLUE CROSS/BLUE SHIELD | Source: Ambulatory Visit | Attending: Obstetrics and Gynecology | Admitting: Obstetrics and Gynecology

## 2017-09-21 DIAGNOSIS — N644 Mastodynia: Secondary | ICD-10-CM | POA: Diagnosis not present

## 2017-09-21 DIAGNOSIS — N63 Unspecified lump in unspecified breast: Secondary | ICD-10-CM

## 2017-09-21 DIAGNOSIS — Z853 Personal history of malignant neoplasm of breast: Secondary | ICD-10-CM

## 2017-09-21 MED ORDER — GADOBENATE DIMEGLUMINE 529 MG/ML IV SOLN
13.0000 mL | Freq: Once | INTRAVENOUS | Status: AC | PRN
  Administered 2017-09-21: 13 mL via INTRAVENOUS

## 2017-10-05 ENCOUNTER — Encounter: Payer: Self-pay | Admitting: Internal Medicine

## 2017-10-05 DIAGNOSIS — Z1211 Encounter for screening for malignant neoplasm of colon: Secondary | ICD-10-CM | POA: Diagnosis not present

## 2017-11-21 ENCOUNTER — Other Ambulatory Visit: Payer: Self-pay | Admitting: Obstetrics and Gynecology

## 2017-11-21 DIAGNOSIS — Z1231 Encounter for screening mammogram for malignant neoplasm of breast: Secondary | ICD-10-CM

## 2018-01-07 ENCOUNTER — Ambulatory Visit
Admission: RE | Admit: 2018-01-07 | Discharge: 2018-01-07 | Disposition: A | Discharge status: Home / Self Care | Payer: BLUE CROSS/BLUE SHIELD | Source: Ambulatory Visit | Attending: Obstetrics and Gynecology | Admitting: Obstetrics and Gynecology

## 2018-01-07 DIAGNOSIS — Z1231 Encounter for screening mammogram for malignant neoplasm of breast: Secondary | ICD-10-CM

## 2018-02-01 DIAGNOSIS — D2262 Melanocytic nevi of left upper limb, including shoulder: Secondary | ICD-10-CM | POA: Diagnosis not present

## 2018-02-01 DIAGNOSIS — L570 Actinic keratosis: Secondary | ICD-10-CM | POA: Diagnosis not present

## 2018-02-01 DIAGNOSIS — L821 Other seborrheic keratosis: Secondary | ICD-10-CM | POA: Diagnosis not present

## 2018-02-01 DIAGNOSIS — D225 Melanocytic nevi of trunk: Secondary | ICD-10-CM | POA: Diagnosis not present

## 2018-02-01 DIAGNOSIS — D2261 Melanocytic nevi of right upper limb, including shoulder: Secondary | ICD-10-CM | POA: Diagnosis not present

## 2018-02-18 ENCOUNTER — Other Ambulatory Visit: Payer: BLUE CROSS/BLUE SHIELD | Admitting: Internal Medicine

## 2018-02-18 DIAGNOSIS — Z853 Personal history of malignant neoplasm of breast: Secondary | ICD-10-CM | POA: Diagnosis not present

## 2018-02-18 DIAGNOSIS — Z0000 Encounter for general adult medical examination without abnormal findings: Secondary | ICD-10-CM

## 2018-02-18 DIAGNOSIS — Z13220 Encounter for screening for lipoid disorders: Secondary | ICD-10-CM | POA: Diagnosis not present

## 2018-02-18 DIAGNOSIS — Z1321 Encounter for screening for nutritional disorder: Secondary | ICD-10-CM | POA: Diagnosis not present

## 2018-02-18 DIAGNOSIS — Z1329 Encounter for screening for other suspected endocrine disorder: Secondary | ICD-10-CM

## 2018-02-19 LAB — TSH: TSH: 3.51 mIU/L

## 2018-02-19 LAB — LIPID PANEL
CHOL/HDL RATIO: 2.2 (calc) (ref <5.0)
Cholesterol: 173 mg/dL (ref <200)
HDL: 79 mg/dL (ref >=50)
LDL Cholesterol (Calc): 80 mg/dL (calc)
Non-HDL Cholesterol (Calc): 94 mg/dL (calc) (ref <130)
TRIGLYCERIDES: 49 mg/dL (ref <150)

## 2018-02-19 LAB — COMPLETE METABOLIC PANEL WITH GFR
AG RATIO: 1.4 (calc) (ref 1.0–2.5)
ALT: 10 U/L (ref 6–29)
AST: 17 U/L (ref 10–35)
Albumin: 3.9 g/dL (ref 3.6–5.1)
Alkaline phosphatase (APISO): 53 U/L (ref 37–153)
BUN: 13 mg/dL (ref 7–25)
CALCIUM: 9.3 mg/dL (ref 8.6–10.4)
CO2: 26 mmol/L (ref 20–32)
CREATININE: 0.62 mg/dL (ref 0.50–1.05)
Chloride: 102 mmol/L (ref 98–110)
GFR, EST AFRICAN AMERICAN: 120 mL/min/{1.73_m2} (ref >=60)
GFR, Est Non African American: 104 mL/min/{1.73_m2} (ref >=60)
GLOBULIN: 2.7 g/dL (ref 1.9–3.7)
Glucose, Bld: 85 mg/dL (ref 65–99)
Potassium: 4.1 mmol/L (ref 3.5–5.3)
SODIUM: 138 mmol/L (ref 135–146)
TOTAL PROTEIN: 6.6 g/dL (ref 6.1–8.1)
Total Bilirubin: 0.5 mg/dL (ref 0.2–1.2)

## 2018-02-19 LAB — CBC WITH DIFFERENTIAL/PLATELET
ABSOLUTE MONOCYTES: 401 {cells}/uL (ref 200–950)
BASOS ABS: 12 {cells}/uL (ref 0–200)
BASOS PCT: 0.2 %
EOS ABS: 83 {cells}/uL (ref 15–500)
Eosinophils Relative: 1.4 %
HCT: 36.4 % (ref 35.0–45.0)
HEMOGLOBIN: 12.5 g/dL (ref 11.7–15.5)
Lymphs Abs: 1156 cells/uL (ref 850–3900)
MCH: 31.3 pg (ref 27.0–33.0)
MCHC: 34.3 g/dL (ref 32.0–36.0)
MCV: 91.2 fL (ref 80.0–100.0)
MPV: 11.4 fL (ref 7.5–12.5)
Monocytes Relative: 6.8 %
NEUTROS ABS: 4248 {cells}/uL (ref 1500–7800)
Neutrophils Relative %: 72 %
Platelets: 167 10*3/uL (ref 140–400)
RBC: 3.99 10*6/uL (ref 3.80–5.10)
RDW: 12.1 % (ref 11.0–15.0)
Total Lymphocyte: 19.6 %
WBC: 5.9 10*3/uL (ref 3.8–10.8)

## 2018-02-19 LAB — VITAMIN D 25 HYDROXY (VIT D DEFICIENCY, FRACTURES): VIT D 25 HYDROXY: 39 ng/mL (ref 30–100)

## 2018-02-22 ENCOUNTER — Encounter: Payer: BLUE CROSS/BLUE SHIELD | Admitting: Internal Medicine

## 2018-03-11 ENCOUNTER — Encounter: Payer: Self-pay | Admitting: Internal Medicine

## 2018-03-11 ENCOUNTER — Ambulatory Visit: Payer: BLUE CROSS/BLUE SHIELD | Admitting: Internal Medicine

## 2018-03-11 VITALS — BP 120/80 | HR 78 | Temp 98.3°F | Ht 64.0 in | Wt 137.0 lb

## 2018-03-11 DIAGNOSIS — M898X9 Other specified disorders of bone, unspecified site: Secondary | ICD-10-CM

## 2018-03-11 NOTE — Patient Instructions (Signed)
Reassured patient right bony prominence occipital area is likely benign.  Follow-up at time of physical exam in a couple of weeks.  We can do a plain x-ray if she is worried about it.

## 2018-03-18 ENCOUNTER — Ambulatory Visit (INDEPENDENT_AMBULATORY_CARE_PROVIDER_SITE_OTHER): Payer: BLUE CROSS/BLUE SHIELD | Admitting: Internal Medicine

## 2018-03-18 ENCOUNTER — Encounter: Payer: Self-pay | Admitting: Internal Medicine

## 2018-03-18 ENCOUNTER — Other Ambulatory Visit: Payer: Self-pay

## 2018-03-18 VITALS — BP 100/60 | HR 64 | Ht 64.0 in | Wt 137.0 lb

## 2018-03-18 DIAGNOSIS — R319 Hematuria, unspecified: Secondary | ICD-10-CM

## 2018-03-18 DIAGNOSIS — Z0000 Encounter for general adult medical examination without abnormal findings: Secondary | ICD-10-CM

## 2018-03-18 LAB — POCT URINALYSIS DIPSTICK
APPEARANCE: NEGATIVE
BILIRUBIN UA: NEGATIVE
Glucose, UA: NEGATIVE
Ketones, UA: NEGATIVE
LEUKOCYTES UA: NEGATIVE
NITRITE UA: NEGATIVE
Odor: NEGATIVE
PH UA: 6.5 (ref 5.0–8.0)
Protein, UA: NEGATIVE
Spec Grav, UA: 1.015 (ref 1.010–1.025)
UROBILINOGEN UA: 0.2 U/dL

## 2018-03-18 MED ORDER — CIPROFLOXACIN HCL 500 MG PO TABS: 500.0000 mg | ORAL_TABLET | Freq: Two times a day (BID) | ORAL | 0 refills | Status: DC

## 2018-03-18 MED ORDER — ONDANSETRON HCL 4 MG PO TABS: 4.0000 mg | ORAL_TABLET | Freq: Three times a day (TID) | ORAL | 0 refills | Status: DC | PRN

## 2018-03-18 NOTE — Progress Notes (Signed)
Subjective:    Patient ID: [name] [name], female    DOB: 21-Aug-1965, 53 y.o.   MRN: [id number]  HPI 53 year old  Female for health maintenance exam and evaluation of medical issues.  She was seen on March 2 with a bony right occipital prominence it was felt to be benign.  She was reassured.  I asked her today if she wanted to get a plain x-ray but she feels it is likely benign.  It has not changed in size and it is not tender.  History of ductal carcinoma in situ diagnosed May 2012.  Tumor was intermediate grade 98% ER positive and 98% PR positive.  She underwent lumpectomy June 2012 to remove the lesion.  All margins were clear.  She was started on tamoxifen.  History of cesarean sections 1999 and 2002.  She had menarche at age 70.  Dr. cousins is GYN physician.  2 pregnancies and 2 deliveries.  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  Social alcohol consumption.  She has a college degree.  2 sons.  The elder son plays lacrosse at [employer].  Husband is self-employed.  Family history: Sister in good health.  Father in good health.  Mother with history of diverticulitis in her late 29s.   Urine specimen shows moderate occult blood on dipstick, however urine microscopic was negative for occult blood with 0-2 red blood cells per high-powered field being noted and culture showed no growth.  All other lab work is entirely within normal limits including TSH lipid panel and vitamin D level CBC and C met    Review of Systems  Constitutional: Negative.   All other systems reviewed and are negative.      Objective:   Physical Exam Vitals signs reviewed.  Constitutional:      General: She is not in acute distress.    Appearance: Normal appearance.  HENT:     Head: Normocephalic and atraumatic.     Comments: Bony prominence right occipital area feels benign    Right Ear: Tympanic membrane and ear canal normal.     Left Ear: Tympanic  membrane and ear canal normal.     Nose: Nose normal.     Mouth/Throat:     Mouth: Mucous membranes are moist.     Pharynx: Oropharynx is clear. No oropharyngeal exudate.  Eyes:     General: No scleral icterus.       Right eye: No discharge.        Left eye: No discharge.     Extraocular Movements: Extraocular movements intact.     Conjunctiva/sclera: Conjunctivae normal.     Pupils: Pupils are equal, round, and reactive to light.  Neck:     Musculoskeletal: Neck supple. No neck rigidity.     Vascular: No carotid bruit.  Cardiovascular:     Rate and Rhythm: Normal rate and regular rhythm.     Pulses: Normal pulses.     Heart sounds: Normal heart sounds. No murmur.  Pulmonary:     Effort: Pulmonary effort is normal. No respiratory distress.     Breath sounds: Normal breath sounds. No wheezing or rales.  Abdominal:     General: Bowel sounds are normal. There is no distension.     Palpations: Abdomen is soft. There is no mass.     Tenderness: There is no abdominal tenderness. There is no rebound.  Genitourinary:    Comments: Deferred to GYN Musculoskeletal:  General: No swelling or deformity.     Right lower leg: No edema.     Left lower leg: No edema.  Lymphadenopathy:     Cervical: No cervical adenopathy.  Skin:    General: Skin is warm and dry.  Neurological:     General: No focal deficit present.     Mental Status: She is alert and oriented to person, place, and time.     Cranial Nerves: No cranial nerve deficit.     Sensory: No sensory deficit.     Motor: No weakness.     Gait: Gait normal.  Psychiatric:        Mood and Affect: Mood normal.        Behavior: Behavior normal.        Thought Content: Thought content normal.        Judgment: Judgment normal.           Assessment & Plan:  Normal health maintenance exam  Bony prominence right occipital area- felt to be benign  Plan: Labs are reviewed and are within normal limits.  Return in 1 to 2 years  or as needed.  Had colonoscopy in 2019.  Continue with annual mammogram and GYN visit.

## 2018-03-19 LAB — URINE CULTURE
MICRO NUMBER:: 294010
SPECIMEN QUALITY: ADEQUATE

## 2018-03-19 LAB — URINALYSIS, MICROSCOPIC ONLY
BACTERIA UA: NONE SEEN /HPF
HYALINE CAST: NONE SEEN /LPF
Squamous Epithelial / LPF: NONE SEEN /HPF (ref <=5)

## 2018-04-06 NOTE — Progress Notes (Signed)
   Subjective:    Patient ID: [name] [name], female    DOB: [birth year], 53 y.o.   MRN: [id number]  HPI She has recently discovered  a bony prominence right occipital area that has her concerned.  She thinks it might be a cyst.  No headache or other neurological disturbance.    Review of Systems See above    Objective:   Physical Exam  There is a right occipital bony prominence behind her right ear that is palpated and felt to be benign.  It is not tender to deep palpation.     Assessment & Plan:  Benign right occipital bony prominence  Plan: Patient reassured.  She has an upcoming physical exam in the near future and we can do a plain x-ray if she continues to have concerns.

## 2018-04-06 NOTE — Patient Instructions (Signed)
It was a pleasure to see you today.  Return in 1 to 2 years or as needed and continue with annual GYN follow-up.  Labs are reviewed and are within normal limits.

## 2018-09-06 DIAGNOSIS — B078 Other viral warts: Secondary | ICD-10-CM | POA: Diagnosis not present

## 2018-12-16 ENCOUNTER — Other Ambulatory Visit: Payer: Self-pay | Admitting: Obstetrics and Gynecology

## 2018-12-16 DIAGNOSIS — Z1231 Encounter for screening mammogram for malignant neoplasm of breast: Secondary | ICD-10-CM

## 2019-02-05 ENCOUNTER — Other Ambulatory Visit: Payer: Self-pay

## 2019-02-05 ENCOUNTER — Ambulatory Visit
Admission: RE | Admit: 2019-02-05 | Discharge: 2019-02-05 | Disposition: A | Discharge status: Home / Self Care | Payer: BC Managed Care – PPO | Source: Ambulatory Visit | Attending: Obstetrics and Gynecology | Admitting: Obstetrics and Gynecology

## 2019-02-05 DIAGNOSIS — Z1231 Encounter for screening mammogram for malignant neoplasm of breast: Secondary | ICD-10-CM | POA: Diagnosis not present

## 2019-02-07 ENCOUNTER — Other Ambulatory Visit: Payer: Self-pay | Admitting: Obstetrics and Gynecology

## 2019-02-07 DIAGNOSIS — R928 Other abnormal and inconclusive findings on diagnostic imaging of breast: Secondary | ICD-10-CM

## 2019-02-13 ENCOUNTER — Ambulatory Visit: Admission: RE | Admit: 2019-02-13 | Payer: BC Managed Care – PPO | Source: Ambulatory Visit

## 2019-02-13 ENCOUNTER — Other Ambulatory Visit: Payer: Self-pay

## 2019-02-13 ENCOUNTER — Ambulatory Visit
Admission: RE | Admit: 2019-02-13 | Discharge: 2019-02-13 | Disposition: A | Discharge status: Home / Self Care | Payer: Managed Care, Other (non HMO) | Source: Ambulatory Visit | Attending: Obstetrics and Gynecology | Admitting: Obstetrics and Gynecology

## 2019-02-13 DIAGNOSIS — R928 Other abnormal and inconclusive findings on diagnostic imaging of breast: Secondary | ICD-10-CM

## 2019-08-28 ENCOUNTER — Encounter: Payer: Self-pay | Admitting: Genetic Counselor

## 2020-01-20 ENCOUNTER — Other Ambulatory Visit: Payer: Self-pay | Admitting: Obstetrics and Gynecology

## 2020-01-20 DIAGNOSIS — Z1231 Encounter for screening mammogram for malignant neoplasm of breast: Secondary | ICD-10-CM

## 2020-03-05 ENCOUNTER — Ambulatory Visit: Payer: Managed Care, Other (non HMO)

## 2020-03-08 ENCOUNTER — Ambulatory Visit
Admission: RE | Admit: 2020-03-08 | Discharge: 2020-03-08 | Disposition: A | Discharge status: Home / Self Care | Payer: Managed Care, Other (non HMO) | Source: Ambulatory Visit | Attending: Obstetrics and Gynecology | Admitting: Obstetrics and Gynecology

## 2020-03-08 ENCOUNTER — Other Ambulatory Visit: Payer: Self-pay

## 2020-03-08 DIAGNOSIS — Z1231 Encounter for screening mammogram for malignant neoplasm of breast: Secondary | ICD-10-CM

## 2020-08-10 ENCOUNTER — Other Ambulatory Visit: Payer: Self-pay | Admitting: Obstetrics and Gynecology

## 2020-08-10 DIAGNOSIS — N63 Unspecified lump in unspecified breast: Secondary | ICD-10-CM

## 2020-08-31 ENCOUNTER — Ambulatory Visit
Admission: RE | Admit: 2020-08-31 | Discharge: 2020-08-31 | Disposition: A | Discharge status: Home / Self Care | Payer: Managed Care, Other (non HMO) | Source: Ambulatory Visit | Attending: Obstetrics and Gynecology | Admitting: Obstetrics and Gynecology

## 2020-08-31 ENCOUNTER — Other Ambulatory Visit: Payer: Self-pay

## 2020-08-31 DIAGNOSIS — N63 Unspecified lump in unspecified breast: Secondary | ICD-10-CM

## 2020-09-06 ENCOUNTER — Encounter (HOSPITAL_COMMUNITY): Payer: Self-pay

## 2020-09-06 ENCOUNTER — Emergency Department (HOSPITAL_COMMUNITY): Payer: Managed Care, Other (non HMO)

## 2020-09-06 ENCOUNTER — Emergency Department (HOSPITAL_COMMUNITY)
Admission: EM | Admit: 2020-09-06 | Discharge: 2020-09-07 | Disposition: A | Discharge status: Home / Self Care | Payer: Managed Care, Other (non HMO) | Attending: Student | Admitting: Student

## 2020-09-06 ENCOUNTER — Other Ambulatory Visit: Payer: Self-pay

## 2020-09-06 ENCOUNTER — Telehealth: Payer: Self-pay | Admitting: Internal Medicine

## 2020-09-06 DIAGNOSIS — R918 Other nonspecific abnormal finding of lung field: Secondary | ICD-10-CM | POA: Diagnosis not present

## 2020-09-06 DIAGNOSIS — Z853 Personal history of malignant neoplasm of breast: Secondary | ICD-10-CM | POA: Diagnosis not present

## 2020-09-06 DIAGNOSIS — Z20822 Contact with and (suspected) exposure to covid-19: Secondary | ICD-10-CM | POA: Insufficient documentation

## 2020-09-06 DIAGNOSIS — R0602 Shortness of breath: Secondary | ICD-10-CM | POA: Diagnosis present

## 2020-09-06 LAB — BASIC METABOLIC PANEL
Anion gap: 8 (ref 5–15)
BUN: 16 mg/dL (ref 6–20)
CO2: 25 mmol/L (ref 22–32)
Calcium: 9.2 mg/dL (ref 8.9–10.3)
Chloride: 102 mmol/L (ref 98–111)
Creatinine, Ser: 0.67 mg/dL (ref 0.44–1.00)
GFR, Estimated: >60 mL/min (ref >60)
Glucose, Bld: 94 mg/dL (ref 70–99)
Potassium: 3.9 mmol/L (ref 3.5–5.1)
Sodium: 135 mmol/L (ref 135–145)

## 2020-09-06 LAB — CBC WITH DIFFERENTIAL/PLATELET
Abs Immature Granulocytes: 0.01 10*3/uL (ref 0.00–0.07)
Basophils Absolute: 0 10*3/uL (ref 0.0–0.1)
Basophils Relative: 1 %
Eosinophils Absolute: 0.1 10*3/uL (ref 0.0–0.5)
Eosinophils Relative: 2 %
HCT: 41.5 % (ref 36.0–46.0)
Hemoglobin: 13.6 g/dL (ref 12.0–15.0)
Immature Granulocytes: 0 %
Lymphocytes Relative: 24 %
Lymphs Abs: 1.3 10*3/uL (ref 0.7–4.0)
MCH: 30.3 pg (ref 26.0–34.0)
MCHC: 32.8 g/dL (ref 30.0–36.0)
MCV: 92.4 fL (ref 80.0–100.0)
Monocytes Absolute: 0.4 10*3/uL (ref 0.1–1.0)
Monocytes Relative: 7 %
Neutro Abs: 3.6 10*3/uL (ref 1.7–7.7)
Neutrophils Relative %: 66 %
Platelets: 186 10*3/uL (ref 150–400)
RBC: 4.49 MIL/uL (ref 3.87–5.11)
RDW: 12.2 % (ref 11.5–15.5)
WBC: 5.4 10*3/uL (ref 4.0–10.5)
nRBC: 0 % (ref 0.0–0.2)

## 2020-09-06 LAB — I-STAT BETA HCG BLOOD, ED (MC, WL, AP ONLY): I-stat hCG, quantitative: <5 m[IU]/mL (ref <5)

## 2020-09-06 LAB — TROPONIN I (HIGH SENSITIVITY): Troponin I (High Sensitivity): 3 ng/L (ref <18)

## 2020-09-06 NOTE — ED Provider Notes (Signed)
Emergency Medicine Provider Triage Evaluation Note  [name] [name] , a 55 y.o. female  was evaluated in triage.  Pt complains of SOB for the past two weeks.  Denies any associated fever, cough, or chest pain.    Hx of breast cancer and recent travel to [facility] about 2 weeks ago.  Had some associated dizziness and felt like she was having trouble catching her breath earlier tonight.  Denies any leg swelling.  Review of Systems  Positive: SOB, dizziness Negative: Fever, cough  Physical Exam  BP (!) 130/92   Pulse 60   Temp 98 F (36.7 C)   Resp 16   SpO2 100%  Gen:   Awake, no distress   Resp:  Normal effort  MSK:   Moves extremities without difficulty  Other:    Medical Decision Making  Medically screening exam initiated at 10:49 PM.  Appropriate orders placed.  [name] [name] was informed that the remainder of the evaluation will be completed by another provider, this initial triage assessment does not replace that evaluation, and the importance of remaining in the ED until their evaluation is complete.  SOB  Hx of breast cancer and recent travel.  No hx of asthma, CHF, or COPD.  PE study ordered.   Montine Circle, PA-C 09/06/20 2253    Teressa Lower, MD 09/07/20 [phone]

## 2020-09-06 NOTE — ED Triage Notes (Signed)
Pt bib [facility].  Pt states she has had shortness of breath x 2 weeks, worsens with laying down or on side. Pt states this evening she started having dizziness and trouble swallowing which made her call EMS.  Pt states the dizziness and trouble swallowing have improved since then. Pt states she currently has a headache and SOB at this time.

## 2020-09-06 NOTE — Telephone Encounter (Signed)
Pt called and is wanting to get a referral put in for Dr Baltazar Apo, Rafael Bihari. In the past couple of weeks she has been having shortness of breath

## 2020-09-07 ENCOUNTER — Encounter: Payer: Self-pay | Admitting: *Deleted

## 2020-09-07 ENCOUNTER — Emergency Department (HOSPITAL_COMMUNITY): Payer: Managed Care, Other (non HMO)

## 2020-09-07 ENCOUNTER — Telehealth: Payer: Self-pay | Admitting: *Deleted

## 2020-09-07 LAB — RESP PANEL BY RT-PCR (FLU A&B, COVID) ARPGX2
Influenza A by PCR: NEGATIVE
Influenza B by PCR: NEGATIVE
SARS Coronavirus 2 by RT PCR: NEGATIVE

## 2020-09-07 LAB — TROPONIN I (HIGH SENSITIVITY): Troponin I (High Sensitivity): 3 ng/L (ref <18)

## 2020-09-07 LAB — BRAIN NATRIURETIC PEPTIDE: B Natriuretic Peptide: 59.5 pg/mL (ref 0.0–100.0)

## 2020-09-07 MED ORDER — IOHEXOL 350 MG/ML SOLN
80.0000 mL | Freq: Once | INTRAVENOUS | Status: AC | PRN
  Administered 2020-09-07: 80 mL via INTRAVENOUS

## 2020-09-07 NOTE — Telephone Encounter (Signed)
Patient stated she had to go the ED room last night and she is waiting for another specialist to call her back. Patient did not want to make an appt at this time. Advised patient to call back to book CPE to remain as a patient here.

## 2020-09-07 NOTE — ED Provider Notes (Signed)
[facility] EMERGENCY DEPARTMENT Provider Note   CSN: [id number] Arrival date & time: 09/06/20  2146     History Chief Complaint  Patient presents with   Shortness of Breath    [name] [name] is a 55 y.o. female.  Patient presents to the ED with 2 weeks of worsening SOB.  She states that the symptoms are worsened with exertion and that she also feels worse when she lies on her side. She denies fevers, chills, chest pain, or cough.  She reports that she recently flew to [facility].  She denies ever having had a PE.  She states that she had breast cancer about 10 years ago.  She denies any other associated symptoms.  The history is provided by the patient. No language interpreter was used.      Past Medical History:  Diagnosis Date   Breast cancer ([place])    stage 0 breast cancer s/p lumpectomy   History of radiation therapy 07/27/10-09/09/10   right breast 61GYtotal/63f   Personal history of radiation therapy    2012    Patient Active Problem List   Diagnosis Date Noted   Neck pain on left side 03/01/2015   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   Cancer of overlapping sites of right female breast ([place] 07/18/2010    Past Surgical History:  Procedure Laterality Date   BREAST LUMPECTOMY Right 06/14/2010   right   CESAREAN SECTION     1999/2002     OB History   No obstetric history on file.     Family History  Problem Relation Age of Onset   Diverticulitis Mother     Social History   Tobacco Use   Smoking status: Never   Smokeless tobacco: Never  Vaping Use   Vaping Use: Never used  Substance Use Topics   Alcohol use: Yes    Home Medications Prior to Admission medications   Medication Sig Start Date End Date Taking? Authorizing Provider  ciprofloxacin (CIPRO) 500 MG tablet Take 1 tablet (500 mg total) by mouth 2 (two) times daily. 03/18/18   BElby Showers MD  Multiple Vitamins-Minerals (MULTIVITAMIN/EXTRA  VITAMIN D3 PO) Take by mouth.    [provider]  ondansetron (ZOFRAN) 4 MG tablet Take 1 tablet (4 mg total) by mouth every 8 (eight) hours as needed for nausea or vomiting. 03/18/18   BElby Showers MD    Allergies    Patient has no known allergies.  Review of Systems   Review of Systems  All other systems reviewed and are negative.  Physical Exam Updated Vital Signs BP (!) 130/92   Pulse 60   Temp 98 F (36.7 C)   Resp 16   SpO2 100%   Physical Exam Vitals and nursing note reviewed.  Constitutional:      General: She is not in acute distress.    Appearance: She is well-developed.  HENT:     Head: Normocephalic and atraumatic.  Eyes:     Conjunctiva/sclera: Conjunctivae normal.  Cardiovascular:     Rate and Rhythm: Normal rate and regular rhythm.     Heart sounds: No murmur heard. Pulmonary:     Effort: Pulmonary effort is normal. No respiratory distress.  Abdominal:     Palpations: Abdomen is soft.     Tenderness: There is no abdominal tenderness.  Musculoskeletal:        General: Normal range of motion.     Cervical back: Neck supple.  Skin:    General: Skin is warm and dry.  Neurological:     Mental Status: She is alert and oriented to person, place, and time.  Psychiatric:        Mood and Affect: Mood normal.        Behavior: Behavior normal.    ED Results / Procedures / Treatments   Labs (all labs ordered are listed, but only abnormal results are displayed) Labs Reviewed  RESP PANEL BY RT-PCR (FLU A&B, COVID) ARPGX2  CBC WITH DIFFERENTIAL/PLATELET  BASIC METABOLIC PANEL  BRAIN NATRIURETIC PEPTIDE  I-STAT BETA HCG BLOOD, ED ([facility], [facility], [facility] ONLY)  TROPONIN I (HIGH SENSITIVITY)  TROPONIN I (HIGH SENSITIVITY)    EKG EKG Interpretation  Date/Time:  Monday September 06 2020 22:27:46 EDT Ventricular Rate:  61 PR Interval:  162 QRS Duration: 82 QT Interval:  428 QTC Calculation: 430 R Axis:   -1 Text Interpretation: Normal sinus rhythm with  sinus arrhythmia Normal ECG Confirmed by Thayer Jew [phone]) on 09/07/2020 1:41:41 AM  Radiology DG Chest 2 View  Result Date: 09/06/2020 CLINICAL DATA:  Shortness of breath. EXAM: CHEST - 2 VIEW COMPARISON:  Chest radiograph dated 11/02/2012. FINDINGS: No focal consolidation, pleural effusion or pneumothorax. The cardiac silhouette is within limits. No acute osseous pathology. Surgical clips of the anterior right chest wall. IMPRESSION: No active cardiopulmonary disease. Electronically Signed   By: Anner Crete M.D.   On: 09/06/2020 23:30   CT Angio Chest PE W and/or Wo Contrast  Result Date: 09/07/2020 CLINICAL DATA:  Concern for pulmonary embolism. EXAM: CT ANGIOGRAPHY CHEST WITH CONTRAST TECHNIQUE: Multidetector CT imaging of the chest was performed using the standard protocol during bolus administration of intravenous contrast. Multiplanar CT image reconstructions and MIPs were obtained to evaluate the vascular anatomy. CONTRAST:  62m OMNIPAQUE IOHEXOL 350 MG/ML SOLN COMPARISON:  Chest radiograph dated 09/06/2020. FINDINGS: Cardiovascular: There is no cardiomegaly or pericardial effusion. The thoracic aorta is unremarkable. The origins of the great vessels of the aortic arch appear patent. No pulmonary artery embolus identified. Mediastinum/Nodes: There is a 1.7 x 1.4 cm right hilar mass or adenopathy. No mediastinal adenopathy. The esophagus is grossly unremarkable. No mediastinal fluid collection. Lungs/Pleura: Linear and streaky density extending from the right hilum along the right minor fissure may represent postobstructive atelectasis. Pneumonia is not excluded. Clinical correlation is recommended. There is no pleural effusion pneumothorax. There is mass effect and compression of the right middle lobe bronchus by the right hilar mass/adenopathy. Upper Abdomen: No acute abnormality. Musculoskeletal: Surgical clips in the right breast. No acute osseous pathology. Review of the MIP images  confirms the above findings. IMPRESSION: 1. No CT evidence of pulmonary embolism. 2. Right hilar mass or adenopathy. Further evaluation with PET-CT is recommended. There is associated mass effect and compression of the right middle lobe bronchus. 3. Linear and streaky density extending from the right hilum along the right minor fissure may represent postobstructive atelectasis. Pneumonia is not excluded. Clinical correlation is recommended. Electronically Signed   By: AAnner CreteM.D.   On: 09/07/2020 00:23    Procedures Procedures   Medications Ordered in ED Medications  iohexol (OMNIPAQUE) 350 MG/ML injection 80 mL (80 mLs Intravenous Contrast Given 09/07/20 0012)    ED Course  I have reviewed the triage vital signs and the nursing notes.  Pertinent labs & imaging results that were available during my care of the patient were reviewed by me and considered in my medical decision making (see chart for  details).    MDM Rules/Calculators/A&P                           Patient here with SOB over the past few weeks.  Seen by me in triage.  She is not hypoxic.  She does have some PE risk factors with her prior cancer and recent travel, but doesn't have any leg swelling.    CT PE study ordered along with labs ordered in triage are notable for mass/adenopathy seen on CT.  This has some mass effect on the right middle lobe bronchus.  I discussed the results of the CT with Dr. Dina Rich, who agrees that patient can be discharged with close outpatient follow-up provided that she is stable.  Patient has stable vitals.  She is in no respiratory distress.  She ambulates maintaining greater than 90% O2 sat.    Labs are all reassuring. COVID negative.  Trop is 3.  No ischemic EKG findings.  I discussed the CT results with the patient, who understands that she will need additional workup to clarify what the etiology is.  I've sent an Epic inbox message to Dr. Julien Nordmann requesting close outpatient  follow-up.  Patient will also call and make an appointment.  She is stable for discharge.  Final Clinical Impression(s) / ED Diagnoses Final diagnoses:  Lung mass    Rx / DC Orders ED Discharge Orders     None        Montine Circle, PA-C 09/07/20 [id number]    Merryl Hacker, MD 09/07/20 [phone]

## 2020-09-07 NOTE — Telephone Encounter (Signed)
I received referral on [name] [name] today. I called and scheduled her to be seen with Dr. Julien Nordmann on 09/16/20 at [facility].

## 2020-09-07 NOTE — Progress Notes (Unsigned)
RNCM noted pt upcoming appointment with [facility] and can obtain oncology appointment from visit.

## 2020-09-07 NOTE — Discharge Instructions (Addendum)
It's unclear what this lung mass is.  You need to follow-up with oncology.  Please call and make an appointment.  I've emailed the oncology team and our social worker to ensure you get prompt follow-up.

## 2020-09-07 NOTE — ED Notes (Signed)
Pt ambulated in hallway well. Oxygen saturations stayed at 100% throughout. Complaints SOB still.

## 2020-09-08 ENCOUNTER — Other Ambulatory Visit: Payer: Self-pay

## 2020-09-08 ENCOUNTER — Encounter: Payer: Self-pay | Admitting: Pulmonary Disease

## 2020-09-08 ENCOUNTER — Ambulatory Visit: Payer: Managed Care, Other (non HMO) | Admitting: Pulmonary Disease

## 2020-09-08 VITALS — BP 116/70 | HR 65 | Temp 97.7°F | Ht 64.0 in | Wt 136.8 lb

## 2020-09-08 DIAGNOSIS — R918 Other nonspecific abnormal finding of lung field: Secondary | ICD-10-CM | POA: Diagnosis not present

## 2020-09-08 DIAGNOSIS — D0511 Intraductal carcinoma in situ of right breast: Secondary | ICD-10-CM | POA: Diagnosis not present

## 2020-09-08 NOTE — Progress Notes (Signed)
Synopsis: Referred in August 2022 for hilar mass by Elby Showers, MD  Subjective:   PATIENT ID: [name] [name] GENDER: female DOB: [birth year], MRN: [id number]  Chief Complaint  Patient presents with   Consult    Consult for mass found on CAT scan, pt states no symptoms .     This is a 55 year old female, past medical history of breast cancer, stage 0 status postlumpectomy, history of radiation.  Patient was recently seen in the emergency department 09/06/2020.  She came for evaluation in the emergency department with complaints of 2 weeks of shortness of breath.  She had no recent sick contacts.  Decision was made for CT scan of the chest.  COVID test was negative.  CTA of the chest revealed a right hilar mass with associated compression of the right middle lobe bronchus.  There was associated atelectasis postobstructive atelectasis related to mass-effect.   Oncology History   No history exists.    Past Medical History:  Diagnosis Date   Breast cancer ([place])    stage 0 breast cancer s/p lumpectomy   History of radiation therapy 07/27/10-09/09/10   right breast 61GYtotal/65f   Personal history of radiation therapy    2012     Family History  Problem Relation Age of Onset   Diverticulitis Mother      Past Surgical History:  Procedure Laterality Date   BREAST LUMPECTOMY Right 06/14/2010   right   CESAREAN SECTION     1999/2002    Social History   Socioeconomic History   Marital status: Married    Spouse name: Not on file   Number of children: Not on file   Years of education: Not on file   Highest education level: Not on file  Occupational History   Not on file  Tobacco Use   Smoking status: Never   Smokeless tobacco: Never  Vaping Use   Vaping Use: Never used  Substance and Sexual Activity   Alcohol use: Yes   Drug use: Not on file   Sexual activity: Not on file  Other Topics Concern   Not on file  Social History Narrative   Not on file    Social Determinants of Health   Financial Resource Strain: Not on file  Food Insecurity: Not on file  Transportation Needs: Not on file  Physical Activity: Not on file  Stress: Not on file  Social Connections: Not on file  Intimate Partner Violence: Not on file     No Known Allergies   Outpatient Medications Prior to Visit  Medication Sig Dispense Refill   Multiple Vitamins-Minerals (MULTIVITAMIN/EXTRA VITAMIN D3 PO) Take by mouth.     ciprofloxacin (CIPRO) 500 MG tablet Take 1 tablet (500 mg total) by mouth 2 (two) times daily. 14 tablet 0   ondansetron (ZOFRAN) 4 MG tablet Take 1 tablet (4 mg total) by mouth every 8 (eight) hours as needed for nausea or vomiting. 20 tablet 0   No facility-administered medications prior to visit.    Review of Systems  Constitutional:  Negative for chills, fever, malaise/fatigue and weight loss.  HENT:  Negative for hearing loss, sore throat and tinnitus.   Eyes:  Negative for blurred vision and double vision.  Respiratory:  Positive for shortness of breath. Negative for cough, hemoptysis, sputum production, wheezing and stridor.   Cardiovascular:  Negative for chest pain, palpitations, orthopnea, leg swelling and PND.  Gastrointestinal:  Negative for abdominal pain, constipation, diarrhea, heartburn, nausea and vomiting.  Genitourinary:  Negative for dysuria, hematuria and urgency.  Musculoskeletal:  Negative for joint pain and myalgias.  Skin:  Negative for itching and rash.  Neurological:  Negative for dizziness, tingling, weakness and headaches.  Endo/Heme/Allergies:  Negative for environmental allergies. Does not bruise/bleed easily.  Psychiatric/Behavioral:  Negative for depression. The patient is not nervous/anxious and does not have insomnia.   All other systems reviewed and are negative.   Objective:  Physical Exam Vitals reviewed.  Constitutional:      General: She is not in acute distress.    Appearance: She is  well-developed.  HENT:     Head: Normocephalic and atraumatic.  Eyes:     General: No scleral icterus.    Conjunctiva/sclera: Conjunctivae normal.     Pupils: Pupils are equal, round, and reactive to light.  Neck:     Vascular: No JVD.     Trachea: No tracheal deviation.  Cardiovascular:     Rate and Rhythm: Normal rate and regular rhythm.     Heart sounds: Normal heart sounds. No murmur heard. Pulmonary:     Effort: Pulmonary effort is normal. No tachypnea, accessory muscle usage or respiratory distress.     Breath sounds: Normal breath sounds. No stridor. No wheezing, rhonchi or rales.  Abdominal:     General: Bowel sounds are normal. There is no distension.     Palpations: Abdomen is soft.     Tenderness: There is no abdominal tenderness.  Musculoskeletal:        General: No tenderness.     Cervical back: Neck supple.  Lymphadenopathy:     Cervical: No cervical adenopathy.  Skin:    General: Skin is warm and dry.     Capillary Refill: Capillary refill takes less than 2 seconds.     Findings: No rash.  Neurological:     Mental Status: She is alert and oriented to person, place, and time.  Psychiatric:        Behavior: Behavior normal.     Vitals:   09/08/20 1531  BP: 116/70  Pulse: 65  Temp: 97.7 F (36.5 C)  TempSrc: Oral  SpO2: 98%  Weight: 136 lb 12.8 oz (62.1 kg)  Height: '5\' 4"'$  (1.626 m)   98% on RA BMI Readings from Last 3 Encounters:  09/08/20 23.48 kg/m  03/18/18 23.52 kg/m  03/11/18 23.52 kg/m   Wt Readings from Last 3 Encounters:  09/08/20 136 lb 12.8 oz (62.1 kg)  03/18/18 137 lb (62.1 kg)  03/11/18 137 lb (62.1 kg)     CBC    Component Value Date/Time   WBC 5.4 09/06/2020 2241   RBC 4.49 09/06/2020 2241   HGB 13.6 09/06/2020 2241   HGB 12.6 02/09/2015 1116   HCT 41.5 09/06/2020 2241   HCT 38.3 02/09/2015 1116   PLT 186 09/06/2020 2241   PLT 139 (L) 02/09/2015 1116   MCV 92.4 09/06/2020 2241   MCV 89.9 02/09/2015 1116   MCH 30.3  09/06/2020 2241   MCHC 32.8 09/06/2020 2241   RDW 12.2 09/06/2020 2241   RDW 12.7 02/09/2015 1116   LYMPHSABS 1.3 09/06/2020 2241   LYMPHSABS 1.0 02/09/2015 1116   MONOABS 0.4 09/06/2020 2241   MONOABS 0.4 02/09/2015 1116   EOSABS 0.1 09/06/2020 2241   EOSABS 0.0 02/09/2015 1116   BASOSABS 0.0 09/06/2020 2241   BASOSABS 0.0 02/09/2015 1116     Chest Imaging: CTA chest 09/06/2020: Right hilar mass with compression of the right middle lobe bronchus. The patient's images have  been independently reviewed by me.    Pulmonary Functions Testing Results: No flowsheet data found.  FeNO:   Pathology:   Echocardiogram:   Heart Catheterization:     Assessment & Plan:     ICD-10-CM   1. Hilar mass  R91.8 Procedural/ Surgical Case Request: VIDEO BRONCHOSCOPY WITH ENDOBRONCHIAL ULTRASOUND    Ambulatory referral to Pulmonology    2. 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  She does have a occlusion of the right middle lobe lateral segment.  Plan: We discussed her abnormal CT imaging today in the office. She is a lifelong non-smoker. I suspect we could be dealing with a carcinoid tumor. Due to the occlusion of the right middle lobe she would likely benefit from bronchoscopy to see if there is tumor within that airway that needs to be removed. If not its likely from extrinsic compression from the lesion within the hilum. Either way bronchoscopy with video bronchoscopy and endobronchial ultrasound transbronchial needle aspiration of the hilar mass is next best step. Today in the office we discussed the risk benefits and alternatives of planning for this procedure. Would like to do this is soon as possible. Patient is agreeable to proceed.  We will try to get approval for an  additional case on 09/14/2020.    Current Outpatient Medications:    Multiple Vitamins-Minerals (MULTIVITAMIN/EXTRA VITAMIN D3 PO), Take by mouth., Disp: , Rfl:   I spent 63 minutes dedicated to the care of this patient on the date of this encounter to include pre-visit review of records, face-to-face time with the patient discussing conditions above, post visit ordering of testing, clinical documentation with the electronic health record, making appropriate referrals as documented, and communicating necessary findings to members of the patients care team.   Garner Nash, [facility] 09/08/2020 5:34 PM

## 2020-09-08 NOTE — Patient Instructions (Addendum)
Thank you for visiting Dr. Valeta Harms at [facility]. Today we recommend the following:  We will be calling you about setting up case if we can get approval for 6th.  If not, case will be on 15th or 16th of September    Please do your part to reduce the spread of COVID-19.

## 2020-09-08 NOTE — H&P (View-Only) (Signed)
Synopsis: Referred in August 2022 for hilar mass by Elby Showers, MD  Subjective:   PATIENT ID: [name] [name] GENDER: female DOB: November 14, 1965, MRN: [id number]  Chief Complaint  Patient presents with   Consult    Consult for mass found on CAT scan, pt states no symptoms .     This is a 55 year old female, past medical history of breast cancer, stage 0 status postlumpectomy, history of radiation.  Patient was recently seen in the emergency department 09/06/2020.  She came for evaluation in the emergency department with complaints of 2 weeks of shortness of breath.  She had no recent sick contacts.  Decision was made for CT scan of the chest.  COVID test was negative.  CTA of the chest revealed a right hilar mass with associated compression of the right middle lobe bronchus.  There was associated atelectasis postobstructive atelectasis related to mass-effect.   Oncology History   No history exists.    Past Medical History:  Diagnosis Date   Breast cancer ([place])    stage 0 breast cancer s/p lumpectomy   History of radiation therapy 07/27/10-09/09/10   right breast 61GYtotal/35f   Personal history of radiation therapy    2012     Family History  Problem Relation Age of Onset   Diverticulitis Mother      Past Surgical History:  Procedure Laterality Date   BREAST LUMPECTOMY Right 06/14/2010   right   CESAREAN SECTION     1999/2002    Social History   Socioeconomic History   Marital status: Married    Spouse name: Not on file   Number of children: Not on file   Years of education: Not on file   Highest education level: Not on file  Occupational History   Not on file  Tobacco Use   Smoking status: Never   Smokeless tobacco: Never  Vaping Use   Vaping Use: Never used  Substance and Sexual Activity   Alcohol use: Yes   Drug use: Not on file   Sexual activity: Not on file  Other Topics Concern   Not on file  Social History Narrative   Not on file    Social Determinants of Health   Financial Resource Strain: Not on file  Food Insecurity: Not on file  Transportation Needs: Not on file  Physical Activity: Not on file  Stress: Not on file  Social Connections: Not on file  Intimate Partner Violence: Not on file     No Known Allergies   Outpatient Medications Prior to Visit  Medication Sig Dispense Refill   Multiple Vitamins-Minerals (MULTIVITAMIN/EXTRA VITAMIN D3 PO) Take by mouth.     ciprofloxacin (CIPRO) 500 MG tablet Take 1 tablet (500 mg total) by mouth 2 (two) times daily. 14 tablet 0   ondansetron (ZOFRAN) 4 MG tablet Take 1 tablet (4 mg total) by mouth every 8 (eight) hours as needed for nausea or vomiting. 20 tablet 0   No facility-administered medications prior to visit.    Review of Systems  Constitutional:  Negative for chills, fever, malaise/fatigue and weight loss.  HENT:  Negative for hearing loss, sore throat and tinnitus.   Eyes:  Negative for blurred vision and double vision.  Respiratory:  Positive for shortness of breath. Negative for cough, hemoptysis, sputum production, wheezing and stridor.   Cardiovascular:  Negative for chest pain, palpitations, orthopnea, leg swelling and PND.  Gastrointestinal:  Negative for abdominal pain, constipation, diarrhea, heartburn, nausea and vomiting.  Genitourinary:  Negative for dysuria, hematuria and urgency.  Musculoskeletal:  Negative for joint pain and myalgias.  Skin:  Negative for itching and rash.  Neurological:  Negative for dizziness, tingling, weakness and headaches.  Endo/Heme/Allergies:  Negative for environmental allergies. Does not bruise/bleed easily.  Psychiatric/Behavioral:  Negative for depression. The patient is not nervous/anxious and does not have insomnia.   All other systems reviewed and are negative.   Objective:  Physical Exam Vitals reviewed.  Constitutional:      General: She is not in acute distress.    Appearance: She is  well-developed.  HENT:     Head: Normocephalic and atraumatic.  Eyes:     General: No scleral icterus.    Conjunctiva/sclera: Conjunctivae normal.     Pupils: Pupils are equal, round, and reactive to light.  Neck:     Vascular: No JVD.     Trachea: No tracheal deviation.  Cardiovascular:     Rate and Rhythm: Normal rate and regular rhythm.     Heart sounds: Normal heart sounds. No murmur heard. Pulmonary:     Effort: Pulmonary effort is normal. No tachypnea, accessory muscle usage or respiratory distress.     Breath sounds: Normal breath sounds. No stridor. No wheezing, rhonchi or rales.  Abdominal:     General: Bowel sounds are normal. There is no distension.     Palpations: Abdomen is soft.     Tenderness: There is no abdominal tenderness.  Musculoskeletal:        General: No tenderness.     Cervical back: Neck supple.  Lymphadenopathy:     Cervical: No cervical adenopathy.  Skin:    General: Skin is warm and dry.     Capillary Refill: Capillary refill takes less than 2 seconds.     Findings: No rash.  Neurological:     Mental Status: She is alert and oriented to person, place, and time.  Psychiatric:        Behavior: Behavior normal.     Vitals:   09/08/20 1531  BP: 116/70  Pulse: 65  Temp: 97.7 F (36.5 C)  TempSrc: Oral  SpO2: 98%  Weight: 136 lb 12.8 oz (62.1 kg)  Height: '5\' 4"'$  (1.626 m)   98% on RA BMI Readings from Last 3 Encounters:  09/08/20 23.48 kg/m  03/18/18 23.52 kg/m  03/11/18 23.52 kg/m   Wt Readings from Last 3 Encounters:  09/08/20 136 lb 12.8 oz (62.1 kg)  03/18/18 137 lb (62.1 kg)  03/11/18 137 lb (62.1 kg)     CBC    Component Value Date/Time   WBC 5.4 09/06/2020 2241   RBC 4.49 09/06/2020 2241   HGB 13.6 09/06/2020 2241   HGB 12.6 02/09/2015 1116   HCT 41.5 09/06/2020 2241   HCT 38.3 02/09/2015 1116   PLT 186 09/06/2020 2241   PLT 139 (L) 02/09/2015 1116   MCV 92.4 09/06/2020 2241   MCV 89.9 02/09/2015 1116   MCH 30.3  09/06/2020 2241   MCHC 32.8 09/06/2020 2241   RDW 12.2 09/06/2020 2241   RDW 12.7 02/09/2015 1116   LYMPHSABS 1.3 09/06/2020 2241   LYMPHSABS 1.0 02/09/2015 1116   MONOABS 0.4 09/06/2020 2241   MONOABS 0.4 02/09/2015 1116   EOSABS 0.1 09/06/2020 2241   EOSABS 0.0 02/09/2015 1116   BASOSABS 0.0 09/06/2020 2241   BASOSABS 0.0 02/09/2015 1116     Chest Imaging: CTA chest 09/06/2020: Right hilar mass with compression of the right middle lobe bronchus. The patient's images have  been independently reviewed by me.    Pulmonary Functions Testing Results: No flowsheet data found.  FeNO:   Pathology:   Echocardiogram:   Heart Catheterization:     Assessment & Plan:     ICD-10-CM   1. Hilar mass  R91.8 Procedural/ Surgical Case Request: VIDEO BRONCHOSCOPY WITH ENDOBRONCHIAL ULTRASOUND    Ambulatory referral to Pulmonology    2. 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  She does have a occlusion of the right middle lobe lateral segment.  Plan: We discussed her abnormal CT imaging today in the office. She is a lifelong non-smoker. I suspect we could be dealing with a carcinoid tumor. Due to the occlusion of the right middle lobe she would likely benefit from bronchoscopy to see if there is tumor within that airway that needs to be removed. If not its likely from extrinsic compression from the lesion within the hilum. Either way bronchoscopy with video bronchoscopy and endobronchial ultrasound transbronchial needle aspiration of the hilar mass is next best step. Today in the office we discussed the risk benefits and alternatives of planning for this procedure. Would like to do this is soon as possible. Patient is agreeable to proceed.  We will try to get approval for an  additional case on 09/14/2020.    Current Outpatient Medications:    Multiple Vitamins-Minerals (MULTIVITAMIN/EXTRA VITAMIN D3 PO), Take by mouth., Disp: , Rfl:   I spent 63 minutes dedicated to the care of this patient on the date of this encounter to include pre-visit review of records, face-to-face time with the patient discussing conditions above, post visit ordering of testing, clinical documentation with the electronic health record, making appropriate referrals as documented, and communicating necessary findings to members of the patients care team.   Garner Nash, [facility] 09/08/2020 5:34 PM

## 2020-09-09 ENCOUNTER — Telehealth: Payer: Self-pay | Admitting: Pulmonary Disease

## 2020-09-09 ENCOUNTER — Encounter (HOSPITAL_COMMUNITY): Payer: Self-pay | Admitting: Pulmonary Disease

## 2020-09-09 DIAGNOSIS — R918 Other nonspecific abnormal finding of lung field: Secondary | ICD-10-CM | POA: Insufficient documentation

## 2020-09-09 NOTE — Telephone Encounter (Signed)
Pt informed of the following:   Covid test 9/2, 8-3  EBUS 9/6 at 2:45pm; 12:15pm arrival time.

## 2020-09-09 NOTE — Progress Notes (Signed)
PCP - Marjo Bicker MD Cardiologist - no  PPM/ICD - Denies Device Orders -  Rep Notified -   Chest x-ray - 09/06/20 EKG - 09/07/20 Stress Test - ECHO - Cardiac Cath -   Sleep Study - none CPAP - none  Fasting Blood Sugar - n/a Checks Blood Sugar _____ times a day  Blood Thinner Instructions:n/a Aspirin Instructions:n/a  ERAS Protcol -n/a PRE-SURGERY Ensure or G2-   COVID TEST- 09/10/20   Anesthesia review: no    All instructions explained to the patient, with a verbal understanding of the material. Patient agrees to go over the instructions while at home for a better understanding. Patient also instructed to self quarantine after being tested for COVID-19. The opportunity to ask questions was provided.

## 2020-09-10 ENCOUNTER — Other Ambulatory Visit: Payer: Self-pay | Admitting: Pulmonary Disease

## 2020-09-10 LAB — SARS CORONAVIRUS 2 (TAT 6-24 HRS): SARS Coronavirus 2: NEGATIVE

## 2020-09-14 ENCOUNTER — Encounter (HOSPITAL_COMMUNITY)
Admission: RE | Disposition: A | Discharge status: Home / Self Care | Payer: Self-pay | Source: Home / Self Care | Attending: Pulmonary Disease

## 2020-09-14 ENCOUNTER — Encounter (HOSPITAL_COMMUNITY): Payer: Self-pay | Admitting: Pulmonary Disease

## 2020-09-14 ENCOUNTER — Ambulatory Visit (HOSPITAL_COMMUNITY): Payer: Managed Care, Other (non HMO) | Admitting: Anesthesiology

## 2020-09-14 ENCOUNTER — Other Ambulatory Visit: Payer: Self-pay

## 2020-09-14 ENCOUNTER — Ambulatory Visit (HOSPITAL_COMMUNITY)
Admission: RE | Admit: 2020-09-14 | Discharge: 2020-09-14 | Disposition: A | Discharge status: Home / Self Care | Payer: Managed Care, Other (non HMO) | Attending: Pulmonary Disease | Admitting: Pulmonary Disease

## 2020-09-14 DIAGNOSIS — D0511 Intraductal carcinoma in situ of right breast: Secondary | ICD-10-CM | POA: Insufficient documentation

## 2020-09-14 DIAGNOSIS — Z98891 History of uterine scar from previous surgery: Secondary | ICD-10-CM | POA: Diagnosis not present

## 2020-09-14 DIAGNOSIS — R846 Abnormal cytological findings in specimens from respiratory organs and thorax: Secondary | ICD-10-CM | POA: Diagnosis not present

## 2020-09-14 DIAGNOSIS — R918 Other nonspecific abnormal finding of lung field: Secondary | ICD-10-CM | POA: Diagnosis present

## 2020-09-14 HISTORY — PX: VIDEO BRONCHOSCOPY WITH ENDOBRONCHIAL ULTRASOUND: SHX6177

## 2020-09-14 HISTORY — PX: FINE NEEDLE ASPIRATION: SHX5430

## 2020-09-14 LAB — POCT PREGNANCY, URINE: Preg Test, Ur: NEGATIVE

## 2020-09-14 SURGERY — BRONCHOSCOPY, WITH EBUS
Anesthesia: General | Laterality: Right

## 2020-09-14 MED ORDER — DROPERIDOL 2.5 MG/ML IJ SOLN: 0.6250 mg | Freq: Once | INTRAMUSCULAR | Status: DC | PRN

## 2020-09-14 MED ORDER — MIDAZOLAM HCL 5 MG/5ML IJ SOLN
INTRAMUSCULAR | Status: DC | PRN
  Administered 2020-09-14: 2 mg via INTRAVENOUS

## 2020-09-14 MED ORDER — ROCURONIUM BROMIDE 10 MG/ML (PF) SYRINGE
PREFILLED_SYRINGE | INTRAVENOUS | Status: DC | PRN
  Administered 2020-09-14: 60 mg via INTRAVENOUS

## 2020-09-14 MED ORDER — PROMETHAZINE HCL 25 MG/ML IJ SOLN: 6.2500 mg | INTRAMUSCULAR | Status: DC | PRN

## 2020-09-14 MED ORDER — OXYCODONE HCL 5 MG PO TABS: 5.0000 mg | ORAL_TABLET | Freq: Once | ORAL | Status: DC | PRN

## 2020-09-14 MED ORDER — PHENYLEPHRINE 40 MCG/ML (10ML) SYRINGE FOR IV PUSH (FOR BLOOD PRESSURE SUPPORT)
PREFILLED_SYRINGE | INTRAVENOUS | Status: DC | PRN
  Administered 2020-09-14 (×4): 80 ug via INTRAVENOUS

## 2020-09-14 MED ORDER — CHLORHEXIDINE GLUCONATE 0.12 % MT SOLN
15.0000 mL | Freq: Once | OROMUCOSAL | Status: AC
  Filled 2020-09-14: qty 15

## 2020-09-14 MED ORDER — ONDANSETRON HCL 4 MG/2ML IJ SOLN
INTRAMUSCULAR | Status: DC | PRN
Start: 2020-09-14 — End: 2020-09-14
  Administered 2020-09-14: 4 mg via INTRAVENOUS

## 2020-09-14 MED ORDER — FENTANYL CITRATE (PF) 100 MCG/2ML IJ SOLN: 25.0000 ug | INTRAMUSCULAR | Status: DC | PRN

## 2020-09-14 MED ORDER — SUGAMMADEX SODIUM 200 MG/2ML IV SOLN
INTRAVENOUS | Status: DC | PRN
  Administered 2020-09-14: 130 mg via INTRAVENOUS

## 2020-09-14 MED ORDER — PROPOFOL 10 MG/ML IV BOLUS
INTRAVENOUS | Status: DC | PRN
  Administered 2020-09-14: 150 mg via INTRAVENOUS

## 2020-09-14 MED ORDER — FENTANYL CITRATE (PF) 100 MCG/2ML IJ SOLN
INTRAMUSCULAR | Status: DC | PRN
  Administered 2020-09-14: 100 ug via INTRAVENOUS

## 2020-09-14 MED ORDER — CHLORHEXIDINE GLUCONATE 0.12 % MT SOLN
OROMUCOSAL | Status: AC
  Administered 2020-09-14: 15 mL via OROMUCOSAL
  Filled 2020-09-14: qty 15

## 2020-09-14 MED ORDER — OXYCODONE HCL 5 MG/5ML PO SOLN
5.0000 mg | Freq: Once | ORAL | Status: DC | PRN
Start: 2020-09-14 — End: 2020-09-14

## 2020-09-14 MED ORDER — DEXAMETHASONE SODIUM PHOSPHATE 4 MG/ML IJ SOLN
INTRAMUSCULAR | Status: DC | PRN
  Administered 2020-09-14: 10 mg via INTRAVENOUS

## 2020-09-14 MED ORDER — LACTATED RINGERS IV SOLN: INTRAVENOUS | Status: DC

## 2020-09-14 MED ORDER — LIDOCAINE 2% (20 MG/ML) 5 ML SYRINGE
INTRAMUSCULAR | Status: DC | PRN
Start: 2020-09-14 — End: 2020-09-14
  Administered 2020-09-14: 60 mg via INTRAVENOUS

## 2020-09-14 SURGICAL SUPPLY — 30 items

## 2020-09-14 NOTE — Anesthesia Postprocedure Evaluation (Signed)
Anesthesia Post Note  Patient: [name] P Bartnick  Procedure(s) Performed: VIDEO BRONCHOSCOPY WITH ENDOBRONCHIAL ULTRASOUND (Right) FINE NEEDLE ASPIRATION (FNA) LINEAR     Patient location during evaluation: PACU Anesthesia Type: General Level of consciousness: awake Pain management: pain level controlled Vital Signs Assessment: post-procedure vital signs reviewed and stable Respiratory status: spontaneous breathing and respiratory function stable Cardiovascular status: stable Postop Assessment: no apparent nausea or vomiting Anesthetic complications: no   No notable events documented.  Last Vitals:  Vitals:   09/14/20 1614 09/14/20 1629  BP: 103/65 108/66  Pulse: (!) 59 (!) 57  Resp: 16 13  Temp:  36.9 C  SpO2: 93% 95%    Last Pain:  Vitals:   09/14/20 1629  PainSc: 0-No pain                 Candra R Pascale Maves

## 2020-09-14 NOTE — Anesthesia Preprocedure Evaluation (Addendum)
Anesthesia Evaluation  Patient identified by MRN, date of birth, ID band Patient awake    Reviewed: Allergy & Precautions, NPO status , Patient's Chart, lab work & pertinent test results  Airway Mallampati: II  TM Distance: >3 FB Neck ROM: Full    Dental no notable dental hx.    Pulmonary  Hilar mass   Pulmonary exam normal breath sounds clear to auscultation       Cardiovascular negative cardio ROS Normal cardiovascular exam Rhythm:Regular Rate:Normal     Neuro/Psych negative neurological ROS  negative psych ROS   GI/Hepatic negative GI ROS, Neg liver ROS,   Endo/Other  negative endocrine ROS  Renal/GU negative Renal ROS  negative genitourinary   Musculoskeletal negative musculoskeletal ROS (+)   Abdominal   Peds negative pediatric ROS (+)  Hematology negative hematology ROS (+)   Anesthesia Other Findings Breast cancer Subjective feeling of SOB  Reproductive/Obstetrics negative OB ROS                            Anesthesia Physical Anesthesia Plan  ASA: 2  Anesthesia Plan: General   Post-op Pain Management:    Induction: Intravenous  PONV Risk Score and Plan: 3 and Treatment may vary due to age or medical condition, Midazolam, Ondansetron and Dexamethasone  Airway Management Planned: Oral ETT  Additional Equipment: None  Intra-op Plan:   Post-operative Plan: Extubation in OR  Informed Consent: I have reviewed the patients History and Physical, chart, labs and discussed the procedure including the risks, benefits and alternatives for the proposed anesthesia with the patient or authorized representative who has indicated his/her understanding and acceptance.     Dental advisory given  Plan Discussed with: CRNA, Anesthesiologist and Surgeon  Anesthesia Plan Comments:         Anesthesia Quick Evaluation

## 2020-09-14 NOTE — Interval H&P Note (Signed)
History and Physical Interval Note:  09/14/2020 2:57 PM  [name] [name]  has presented today for surgery, with the diagnosis of hilar mass.  The various methods of treatment have been discussed with the patient and family. After consideration of risks, benefits and other options for treatment, the patient has consented to  Procedure(s): [place] (Right) as a surgical intervention.  The patient's history has been reviewed, patient examined, no change in status, stable for surgery.  I have reviewed the patient's chart and labs.  Questions were answered to the patient's satisfaction.     [place]

## 2020-09-14 NOTE — Discharge Instructions (Signed)
Flexible Bronchoscopy, Care After This sheet gives you information about how to care for yourself after your test. Your doctor may also give you more specific instructions. If you have problems or questions, contact your doctor. Follow these instructions at home: Eating and drinking Do not eat or drink anything (not even water) for 2 hours after your test, or until your numbing medicine (local anesthetic) wears off. When your numbness is gone and your cough and gag reflexes have come back, you may: Eat only soft foods. Slowly drink liquids. The day after the test, go back to your normal diet. Driving Do not drive for 24 hours if you were given a medicine to help you relax (sedative). Do not drive or use heavy machinery while taking prescription pain medicine. General instructions  Take over-the-counter and prescription medicines only as told by your doctor. Return to your normal activities as told. Ask what activities are safe for you. Do not use any products that have nicotine or tobacco in them. This includes cigarettes and e-cigarettes. If you need help quitting, ask your doctor. Keep all follow-up visits as told by your doctor. This is important. It is very important if you had a tissue sample (biopsy) taken. Get help right away if: You have shortness of breath that gets worse. You get light-headed. You feel like you are going to pass out (faint). You have chest pain. You cough up: More than a little blood. More blood than before. Summary Do not eat or drink anything (not even water) for 2 hours after your test, or until your numbing medicine wears off. Do not use cigarettes. Do not use e-cigarettes. Get help right away if you have chest pain.  This information is not intended to replace advice given to you by your health care provider. Make sure you discuss any questions you have with your health care provider. Document Released: 10/23/2008 Document Revised: 12/08/2016 Document  Reviewed: 01/14/2016 Elsevier Patient Education  2020 [employer].

## 2020-09-14 NOTE — Op Note (Signed)
Video Bronchoscopy with Endobronchial Ultrasound Procedure Note  Date of Operation: 09/14/2020  Pre-op Diagnosis: Right hilar mass  Post-op Diagnosis: Right hilar mass  Surgeon: Octavio Graves Lilygrace Rodick,DO   Assistants: None   Anesthesia: General endotracheal anesthesia  Operation: Flexible video fiberoptic bronchoscopy with endobronchial ultrasound and biopsies.  Estimated Blood Loss: Minimal  Complications: None   Indications and History: [name] [name] is a 55 y.o. female with right hilar mass.  The risks, benefits, complications, treatment options and expected outcomes were discussed with the patient.  The possibilities of pneumothorax, pneumonia, reaction to medication, pulmonary aspiration, perforation of a viscus, bleeding, failure to diagnose a condition and creating a complication requiring transfusion or operation were discussed with the patient who freely signed the consent.    Description of Procedure: The patient was examined in the preoperative area and history and data from the preprocedure consultation were reviewed. It was deemed appropriate to proceed.  The patient was taken to [facility] endoscopy room 3, identified as [name] [name] and the procedure verified as Flexible Video Fiberoptic Bronchoscopy.  A Time Out was held and the above information confirmed. After being taken to the operating room general anesthesia was initiated and the patient  was orally intubated. The video fiberoptic bronchoscope was introduced via the endotracheal tube and a general inspection was performed which showed normal right and left lung anatomy, RML with near total occlusion and obstruction from extrinsic compression from a infra right hilar mass. The standard scope was then withdrawn and the endobronchial ultrasound was used to identify and characterize the peritracheal, hilar and bronchial lymph nodes. Inspection showed enlarged medial right infrahilar mass just distal to the opening of the  right middle lobe. Using real-time ultrasound guidance Wang needle biopsies were take from right hilar mass, just inferior to the opening of the right middle lobe and were sent for cytology. The patient tolerated the procedure well without apparent complications.  Following biopsy she did have a good portion of bleeding.  There was a blood clot extending the full length of the right mainstem spilling over into the left nonoccluding portion of the endotracheal tube.  This was removed in multiple sections using therapeutic aspiration of the bilateral mainstem's with a therapeutic bronchoscope.  Saline was used for irrigation.  All distal subsegments were patent at the termination of the procedure and there was no active bleeding noted from the biopsy site. The bronchoscope was withdrawn. Anesthesia was reversed and the patient was taken to the PACU for recovery.   Samples: 1.  Right hilar mass  Plans:  The patient will be discharged from the PACU to home when recovered from anesthesia. We will review the cytology, pathology and microbiology results with the patient when they become available. Outpatient followup will be with [name] [name], [name] [name] [name] Burlison, DO [facility] 09/14/2020 4:05 PM

## 2020-09-14 NOTE — Anesthesia Procedure Notes (Signed)
Procedure Name: Intubation Date/Time: 09/14/2020 3:10 PM Performed by: Renato Shin, CRNA Pre-anesthesia Checklist: Patient identified, Emergency Drugs available, Suction available and Patient being monitored Patient Re-evaluated:Patient Re-evaluated prior to induction Oxygen Delivery Method: Circle system utilized Preoxygenation: Pre-oxygenation with 100% oxygen Induction Type: IV induction Ventilation: Mask ventilation without difficulty Laryngoscope Size: Miller and 2 Grade View: Grade I Tube type: Oral Tube size: 8.5 mm Number of attempts: 1 Airway Equipment and Method: Stylet and Oral airway Placement Confirmation: ETT inserted through vocal cords under direct vision, positive ETCO2 and breath sounds checked- equal and bilateral Secured at: 21 cm Tube secured with: Tape Dental Injury: Teeth and Oropharynx as per pre-operative assessment

## 2020-09-14 NOTE — Transfer of Care (Signed)
Immediate Anesthesia Transfer of Care Note  Patient: [name] [name]  Procedure(s) Performed: VIDEO BRONCHOSCOPY WITH ENDOBRONCHIAL ULTRASOUND (Right) FINE NEEDLE ASPIRATION (FNA) LINEAR  Patient Location: PACU  Anesthesia Type:General  Level of Consciousness: awake and patient cooperative  Airway & Oxygen Therapy: Patient Spontanous Breathing and Patient connected to face mask oxygen  Post-op Assessment: Report given to RN and Post -op Vital signs reviewed and stable  Post vital signs: Reviewed and stable  Last Vitals:  Vitals Value Taken Time  BP 112/71 09/14/20 1559  Temp 36.1 C 09/14/20 1559  Pulse 66 09/14/20 1606  Resp 18 09/14/20 1606  SpO2 93 % 09/14/20 1606  Vitals shown include unvalidated device data.  Last Pain:  Vitals:   09/14/20 1559  PainSc: 0-No pain         Complications: No notable events documented.

## 2020-09-15 ENCOUNTER — Telehealth: Payer: Self-pay | Admitting: *Deleted

## 2020-09-15 ENCOUNTER — Ambulatory Visit (INDEPENDENT_AMBULATORY_CARE_PROVIDER_SITE_OTHER): Payer: Managed Care, Other (non HMO) | Admitting: Pulmonary Disease

## 2020-09-15 ENCOUNTER — Encounter (HOSPITAL_COMMUNITY): Payer: Self-pay | Admitting: Pulmonary Disease

## 2020-09-15 ENCOUNTER — Telehealth: Payer: Self-pay | Admitting: Pulmonary Disease

## 2020-09-15 DIAGNOSIS — R232 Flushing: Secondary | ICD-10-CM | POA: Insufficient documentation

## 2020-09-15 DIAGNOSIS — C7A090 Malignant carcinoid tumor of the bronchus and lung: Secondary | ICD-10-CM | POA: Insufficient documentation

## 2020-09-15 DIAGNOSIS — R519 Headache, unspecified: Secondary | ICD-10-CM | POA: Insufficient documentation

## 2020-09-15 DIAGNOSIS — R200 Anesthesia of skin: Secondary | ICD-10-CM | POA: Insufficient documentation

## 2020-09-15 DIAGNOSIS — R918 Other nonspecific abnormal finding of lung field: Secondary | ICD-10-CM | POA: Diagnosis not present

## 2020-09-15 DIAGNOSIS — M79604 Pain in right leg: Secondary | ICD-10-CM | POA: Diagnosis not present

## 2020-09-15 DIAGNOSIS — Z853 Personal history of malignant neoplasm of breast: Secondary | ICD-10-CM | POA: Insufficient documentation

## 2020-09-15 NOTE — Patient Instructions (Signed)
Full PFT performed today. °

## 2020-09-15 NOTE — Telephone Encounter (Signed)
I called and spoke with patient regarding BI recs. Patient verbalized understanding, nothing further needed.

## 2020-09-15 NOTE — Progress Notes (Signed)
Full PFT performed today. °

## 2020-09-15 NOTE — Telephone Encounter (Signed)
BI pt was calling to see if she can be scheduled to have a PET scan set up.  She was last seen by you on 08/31 and her bronch was done yesterday.  Thanks   Plan: We discussed her abnormal CT imaging today in the office. She is a lifelong non-smoker. I suspect we could be dealing with a carcinoid tumor. Due to the occlusion of the right middle lobe she would likely benefit from bronchoscopy to see if there is tumor within that airway that needs to be removed. If not its likely from extrinsic compression from the lesion within the hilum. Either way bronchoscopy with video bronchoscopy and endobronchial ultrasound transbronchial needle aspiration of the hilar mass is next best step. Today in the office we discussed the risk benefits and alternatives of planning for this procedure. Would like to do this is soon as possible. Patient is agreeable to proceed.  We will try to get approval for an additional case on 09/14/2020.

## 2020-09-15 NOTE — Telephone Encounter (Signed)
I called and cancelled her appt at [facility]. She verbalized understanding.

## 2020-09-16 ENCOUNTER — Telehealth: Payer: Self-pay | Admitting: Internal Medicine

## 2020-09-16 ENCOUNTER — Inpatient Hospital Stay: Payer: Managed Care, Other (non HMO)

## 2020-09-16 ENCOUNTER — Emergency Department (HOSPITAL_BASED_OUTPATIENT_CLINIC_OR_DEPARTMENT_OTHER)
Admission: EM | Admit: 2020-09-16 | Discharge: 2020-09-16 | Disposition: A | Discharge status: Home / Self Care | Payer: Managed Care, Other (non HMO) | Attending: Emergency Medicine | Admitting: Emergency Medicine

## 2020-09-16 ENCOUNTER — Emergency Department (HOSPITAL_BASED_OUTPATIENT_CLINIC_OR_DEPARTMENT_OTHER)
Admission: EM | Admit: 2020-09-16 | Discharge: 2020-09-16 | Disposition: A | Discharge status: Home / Self Care | Payer: Managed Care, Other (non HMO) | Source: Home / Self Care | Attending: Emergency Medicine | Admitting: Emergency Medicine

## 2020-09-16 ENCOUNTER — Emergency Department (HOSPITAL_BASED_OUTPATIENT_CLINIC_OR_DEPARTMENT_OTHER): Payer: Managed Care, Other (non HMO) | Admitting: Radiology

## 2020-09-16 ENCOUNTER — Emergency Department (HOSPITAL_BASED_OUTPATIENT_CLINIC_OR_DEPARTMENT_OTHER): Payer: Managed Care, Other (non HMO)

## 2020-09-16 ENCOUNTER — Inpatient Hospital Stay: Payer: Managed Care, Other (non HMO) | Admitting: Internal Medicine

## 2020-09-16 ENCOUNTER — Encounter (HOSPITAL_BASED_OUTPATIENT_CLINIC_OR_DEPARTMENT_OTHER): Payer: Self-pay | Admitting: Emergency Medicine

## 2020-09-16 ENCOUNTER — Ambulatory Visit: Payer: Managed Care, Other (non HMO) | Admitting: Radiation Oncology

## 2020-09-16 ENCOUNTER — Telehealth: Payer: Self-pay | Admitting: Pulmonary Disease

## 2020-09-16 ENCOUNTER — Other Ambulatory Visit: Payer: Self-pay

## 2020-09-16 DIAGNOSIS — R202 Paresthesia of skin: Secondary | ICD-10-CM

## 2020-09-16 DIAGNOSIS — R232 Flushing: Secondary | ICD-10-CM | POA: Insufficient documentation

## 2020-09-16 DIAGNOSIS — D491 Neoplasm of unspecified behavior of respiratory system: Secondary | ICD-10-CM

## 2020-09-16 DIAGNOSIS — M79606 Pain in leg, unspecified: Secondary | ICD-10-CM | POA: Insufficient documentation

## 2020-09-16 DIAGNOSIS — Z853 Personal history of malignant neoplasm of breast: Secondary | ICD-10-CM | POA: Insufficient documentation

## 2020-09-16 DIAGNOSIS — C7A090 Malignant carcinoid tumor of the bronchus and lung: Secondary | ICD-10-CM | POA: Insufficient documentation

## 2020-09-16 LAB — COMPREHENSIVE METABOLIC PANEL
ALT: 12 U/L (ref 0–44)
AST: 15 U/L (ref 15–41)
Albumin: 4.5 g/dL (ref 3.5–5.0)
Alkaline Phosphatase: 62 U/L (ref 38–126)
Anion gap: 9 (ref 5–15)
BUN: 13 mg/dL (ref 6–20)
CO2: 27 mmol/L (ref 22–32)
Calcium: 9.6 mg/dL (ref 8.9–10.3)
Chloride: 102 mmol/L (ref 98–111)
Creatinine, Ser: 0.58 mg/dL (ref 0.44–1.00)
GFR, Estimated: >60 mL/min (ref >60)
Glucose, Bld: 88 mg/dL (ref 70–99)
Potassium: 3.9 mmol/L (ref 3.5–5.1)
Sodium: 138 mmol/L (ref 135–145)
Total Bilirubin: 0.6 mg/dL (ref 0.3–1.2)
Total Protein: 7.5 g/dL (ref 6.5–8.1)

## 2020-09-16 LAB — URINALYSIS, ROUTINE W REFLEX MICROSCOPIC
Bilirubin Urine: NEGATIVE
Glucose, UA: NEGATIVE mg/dL
Hgb urine dipstick: NEGATIVE
Ketones, ur: NEGATIVE mg/dL
Leukocytes,Ua: NEGATIVE
Nitrite: NEGATIVE
Protein, ur: NEGATIVE mg/dL
Specific Gravity, Urine: <1.005 — ABNORMAL LOW (ref 1.005–1.030)
pH: 6.5 (ref 5.0–8.0)

## 2020-09-16 LAB — CBC WITH DIFFERENTIAL/PLATELET
Abs Immature Granulocytes: 0.02 10*3/uL (ref 0.00–0.07)
Basophils Absolute: 0 10*3/uL (ref 0.0–0.1)
Basophils Relative: 1 %
Eosinophils Absolute: 0.1 10*3/uL (ref 0.0–0.5)
Eosinophils Relative: 1 %
HCT: 40.3 % (ref 36.0–46.0)
Hemoglobin: 13.2 g/dL (ref 12.0–15.0)
Immature Granulocytes: 0 %
Lymphocytes Relative: 34 %
Lymphs Abs: 2.6 10*3/uL (ref 0.7–4.0)
MCH: 29.7 pg (ref 26.0–34.0)
MCHC: 32.8 g/dL (ref 30.0–36.0)
MCV: 90.8 fL (ref 80.0–100.0)
Monocytes Absolute: 0.5 10*3/uL (ref 0.1–1.0)
Monocytes Relative: 6 %
Neutro Abs: 4.5 10*3/uL (ref 1.7–7.7)
Neutrophils Relative %: 58 %
Platelets: 202 10*3/uL (ref 150–400)
RBC: 4.44 MIL/uL (ref 3.87–5.11)
RDW: 12.5 % (ref 11.5–15.5)
WBC: 7.7 10*3/uL (ref 4.0–10.5)
nRBC: 0 % (ref 0.0–0.2)

## 2020-09-16 LAB — MAGNESIUM: Magnesium: 2.1 mg/dL (ref 1.7–2.4)

## 2020-09-16 LAB — TSH: TSH: 8.519 u[IU]/mL — ABNORMAL HIGH (ref 0.350–4.500)

## 2020-09-16 LAB — HCG, QUANTITATIVE, PREGNANCY: hCG, Beta Chain, Quant, S: 6 m[IU]/mL — ABNORMAL HIGH (ref <5)

## 2020-09-16 MED ORDER — IOHEXOL 350 MG/ML SOLN
100.0000 mL | Freq: Once | INTRAVENOUS | Status: AC | PRN
  Administered 2020-09-16: 60 mL via INTRAVENOUS

## 2020-09-16 MED ORDER — ALPRAZOLAM 0.5 MG PO TABS: ORAL_TABLET | ORAL | 0 refills | Status: DC

## 2020-09-16 MED ORDER — LACTATED RINGERS IV BOLUS
1000.0000 mL | Freq: Once | INTRAVENOUS | Status: AC
  Administered 2020-09-16: 1000 mL via INTRAVENOUS

## 2020-09-16 NOTE — Telephone Encounter (Signed)
Pt called back she would like the Xanax called in, she has also scheduled an appt for 9/13

## 2020-09-16 NOTE — ED Notes (Signed)
Patient left ED with ABCs intact, alert and oriented x4, respirations even and unlabored. Discharge instructions reviewed and all questions answered.   

## 2020-09-16 NOTE — Telephone Encounter (Signed)
Left her a phone message before lunch. Reviewed labs TSH is elevated. We can see her and repeat it next week to see if thyroid is underactive. Happy to prescribe Xanax for anxiety if needed. Awaiting path report. MJB, MD

## 2020-09-16 NOTE — Telephone Encounter (Signed)
Primary Pulmonologist: Dr. Valeta Harms Last office visit and with whom: 09/08/20 with Dr. Valeta Harms What do we see them for (pulmonary problems): Hilar mass, Abn CT of chest, ductal carcinoma in situ Last OV assessment/plan: see below  Was appointment offered to patient (explain)?    Plan: We discussed her abnormal CT imaging today in the office. She is a lifelong non-smoker. I suspect we could be dealing with a carcinoid tumor. Due to the occlusion of the right middle lobe she would likely benefit from bronchoscopy to see if there is tumor within that airway that needs to be removed. If not its likely from extrinsic compression from the lesion within the hilum. Either way bronchoscopy with video bronchoscopy and endobronchial ultrasound transbronchial needle aspiration of the hilar mass is next best step. Today in the office we discussed the risk benefits and alternatives of planning for this procedure. Would like to do this is soon as possible. Patient is agreeable to proceed.  We will try to get approval for an additional case on 09/14/2020    Reason for call: patient went to the ER last night for leg pain and headache when worrying about symptoms. Patient stated she is still feeling pressure her right eye and legs still feeling tingly. They did do a CT angio on head and neck and was normal. Patient was told to come back if not feeling better. She is not sure if she is working herself up but she just doesn't feel well. Not sure if she should go back to ER or not. Requesting recs. Will route to Dr. Valeta Harms.  Dr. Valeta Harms, please advise . Thanks!  (examples of things to ask: : When did symptoms start? Fever? Cough? Productive? Color to sputum? More sputum than usual? Wheezing? Have you needed increased oxygen? Are you taking your respiratory medications? What over the counter measures have you tried?)  Allergies  Allergen Reactions   Elemental Sulfur Hives    Immunization History  Administered  Date(s) Administered   PFIZER Comirnaty(Gray Top)Covid-19 Tri-Sucrose Vaccine 03/26/2020   PFIZER(Purple Top)SARS-COV-2 Vaccination 04/24/2019   Typhoid Inactivated 10/29/2019

## 2020-09-16 NOTE — Telephone Encounter (Signed)
Patient called back regarding phone message from. Will come in next week for repeat TSH as it was elevated in ED today and could be due to primary hypothyroidism. Also will send in Xanax 0.5 mg one half to one tab up to 3 times daily as needed for anxiety. Follow up with appt next week here on Tuesday.

## 2020-09-16 NOTE — Discharge Instructions (Addendum)
Please follow up with your pulmonologist for lung biopsy results

## 2020-09-16 NOTE — ED Provider Notes (Signed)
[facility] EMERGENCY DEPT Provider Note   CSN: [id number] Arrival date & time: 09/15/20  2357     History Chief Complaint  Patient presents with   Numbness    [name] [name] is a 55 y.o. female.  Was walking when she started having sharp pain in both legs below the knees after resting. The left leg improved quickly but the right leg still had some sharp pain. No edema. Did have a right sided headache that happened whenever she started to worry about the symptoms. Still with some right sided head pressure right now. No nausea or vomiting. Recently diagnosed with a hilar mass likely c/w cancer, biopsy done a couple days ago for same.        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   [place]    Past Surgical History:  Procedure Laterality Date   BREAST LUMPECTOMY Right 06/14/2010   right   CESAREAN SECTION     1999/2002   FINE NEEDLE ASPIRATION  09/14/2020   Procedure: FINE NEEDLE ASPIRATION (FNA) LINEAR;  Surgeon: IGarner Nash DO;  [facility];  Service: Pulmonary;;   VIDEO BRONCHOSCOPY WITH ENDOBRONCHIAL ULTRASOUND Right 09/14/2020   Procedure: VIDEO BRONCHOSCOPY WITH ENDOBRONCHIAL ULTRASOUND;  Surgeon: IGarner Nash DO;  [facility]  Service: Pulmonary;  Laterality: Right;     OB History   No obstetric history on file.     Family History  Problem Relation Age of Onset   Diverticulitis Mother     Social History   Tobacco Use   Smoking status: Never   Smokeless tobacco: Never  Vaping Use    Vaping Use: Never used  Substance Use Topics   Alcohol use: Yes    Comment: 5-7glasses of wine/ week   Drug use: Never    Home Medications Prior to Admission medications   Medication Sig Start Date End Date Taking? Authorizing Provider  Multiple Vitamins-Minerals (MULTIVITAMIN/EXTRA VITAMIN D3 PO) Take 1 tablet by mouth daily.    [provider]    Allergies    Elemental sulfur  Review of Systems   Review of Systems  All other systems reviewed and are negative.  Physical Exam Updated Vital Signs BP 116/79 (BP Location: Right Arm)   Pulse (!) 59   Temp 97.8 F (36.6 C) (Oral)   Resp 18   Ht '5\' 4"'$  (1.626 m)   Wt 61.7 kg   SpO2 100%   BMI 23.35 kg/m   Physical Exam Vitals and nursing note reviewed.  Constitutional:      Appearance: She is well-developed.  HENT:     Head: Normocephalic and atraumatic.     Mouth/Throat:     Mouth: Mucous membranes are moist.     Pharynx: Oropharynx is clear.  Eyes:     Pupils: Pupils are equal, round, and reactive to light.  Cardiovascular:     Rate and Rhythm: Normal rate and regular rhythm.  Pulmonary:     Effort: No respiratory distress.     Breath  sounds: No stridor.  Abdominal:     General: Abdomen is flat. There is no distension.  Musculoskeletal:        General: No swelling or tenderness. Normal range of motion.     Cervical back: Normal range of motion.  Skin:    General: Skin is warm and dry.     Coloration: Skin is not jaundiced.     Findings: No bruising.  Neurological:     General: No focal deficit present.     Mental Status: She is alert and oriented to person, place, and time.     Cranial Nerves: No cranial nerve deficit.     Sensory: No sensory deficit.     Motor: No weakness.     Coordination: Coordination normal.     Gait: Gait normal.     Deep Tendon Reflexes: Reflexes normal.  Psychiatric:        Mood and Affect: Mood normal.    ED Results / Procedures / Treatments   Labs (all labs  ordered are listed, but only abnormal results are displayed) Labs Reviewed  HCG, QUANTITATIVE, PREGNANCY - Abnormal; Notable for the following components:      Result Value   hCG, Beta Chain, Quant, S 6 (*)    All other components within normal limits  URINALYSIS, ROUTINE W REFLEX MICROSCOPIC - Abnormal; Notable for the following components:   Color, Urine COLORLESS (*)    Specific Gravity, Urine <1.005 (*)    All other components within normal limits  CBC WITH DIFFERENTIAL/PLATELET  COMPREHENSIVE METABOLIC PANEL  MAGNESIUM  TSH  T4    EKG None  Radiology CT Angio Head W or Wo Contrast  Result Date: 09/16/2020 CLINICAL DATA:  54 year old female with bilateral lower extremity numbness onset 2000 hours. Shortness of breath and difficulty swallowing. Right hilar/lung mass on CTA last month. EXAM: CT ANGIOGRAPHY HEAD AND NECK TECHNIQUE: Multidetector CT imaging of the head and neck was performed using the standard protocol during bolus administration of intravenous contrast. Multiplanar CT image reconstructions and MIPs were obtained to evaluate the vascular anatomy. Carotid stenosis measurements (when applicable) are obtained utilizing NASCET criteria, using the distal internal carotid diameter as the denominator. CONTRAST:  51m OMNIPAQUE IOHEXOL 350 MG/ML SOLN COMPARISON:  Chest radiographs today.  Chest CTA 09/07/2020. FINDINGS: CT HEAD Brain: Cerebral volume is within normal limits. No midline shift, ventriculomegaly, mass effect, evidence of mass lesion, intracranial hemorrhage or evidence of cortically based acute infarction. Gray-white matter differentiation is within normal limits throughout the brain. Partially empty sella, nonspecific. Calvarium and skull base: No acute osseous abnormality identified. Paranasal sinuses: Visualized paranasal sinuses and mastoids are clear. Orbits: Visualized orbits and scalp soft tissues are within normal limits. CTA NECK Skeleton: Mild degenerative  appearing anterolisthesis at C4-C5 with associated facet arthropathy greater on the right. Chronic disc and endplate degeneration at the level below that. No acute or suspicious osseous lesion identified. Upper chest: Upper lungs remain clear. Other neck: Symmetric larynx and pharynx. No neck mass, lymphadenopathy, or acute soft tissue finding identified. Aortic arch: 4 vessel arch configuration. The left vertebral artery arises directly from the arch. No arch atherosclerosis. Right carotid system: Negative aside from a mildly beaded appearance of the cervical right ICA on series 16, image 23, raising the possibility of Fibromuscular Dysplasia (FMD). Left carotid system: Negative. Vertebral arteries: Proximal right subclavian artery and right vertebral artery origin are normal. The right vertebral artery is dominant and normal to the skull base. Non dominant left vertebral  artery arises directly from the arch with no atherosclerosis or stenosis. The left vertebral remains non dominant but patent to the skull base. CTA HEAD Posterior circulation: Dominant right V4 segment primarily supplies the basilar. Patent PICA origins, the left V4 is diminutive beyond PICA. Patent basilar artery without stenosis. Patent SCA and PCA origins. Posterior communicating arteries are diminutive or absent. Bilateral PCA branches are within normal limits. Anterior circulation: Both ICA siphons are patent with no plaque or stenosis. Normal ophthalmic artery origins. Patent carotid termini. Normal MCA and ACA origins. Anterior communicating artery and bilateral ACA branches are within normal limits. Left MCA M1 segment and bifurcation are patent without stenosis. Right MCA M1 segment and bifurcation are patent without stenosis. Bilateral MCA branches are within normal limits. Venous sinuses: Patent. Anatomic variants: Dominant right vertebral artery that primarily supplies the basilar. Non dominant left vertebral arises directly from the  arch and functionally terminates in PICA. No early abnormal Other findings: Intracranial enhancement is identified. Review of the MIP images confirms the above findings IMPRESSION: 1. 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 2. Negative CT appearance of the brain. Note that MRI without and with contrast would be most sensitive for metastatic disease. Electronically Signed   By: Genevie Ann M.D.   On: 09/16/2020 06:29   DG Chest 2 View  Result Date: 09/16/2020 CLINICAL DATA:  55 year old female with bilateral lower extremity numbness onset 2000 hours. Shortness of breath and difficulty swallowing. Right hilar/lung mass on CTA last month. EXAM: CHEST - 2 VIEW COMPARISON:  CTA chest 09/07/2020 and earlier. FINDINGS: Abnormal right hilum redemonstrated with mild associated streaky perihilar opacity stable from the recent CTA. Other mediastinal contours are within normal limits. Visualized tracheal air column is within normal limits. No pneumothorax, pulmonary edema, pleural effusion or other confluent pulmonary opacity. Superimposed right breast surgical clips. No acute osseous abnormality identified. Negative visible bowel gas. IMPRESSION: 1. Right hilar Mass with associated streaky perihilar right lung opacity stable from the recent Chest CTA. Favor Bronchogenic Carcinoma and recommend PET-CT, referral to [facility]). 2. No new cardiopulmonary abnormality. Electronically Signed   By: Genevie Ann M.D.   On: 09/16/2020 06:21   CT Angio Neck W and/or Wo Contrast  Result Date: 09/16/2020 CLINICAL DATA:  55 year old female with bilateral lower extremity numbness onset 2000 hours. Shortness of breath and difficulty swallowing. Right hilar/lung mass on CTA last month. EXAM: CT ANGIOGRAPHY HEAD AND NECK TECHNIQUE: Multidetector CT imaging of the head and neck was performed using the standard  protocol during bolus administration of intravenous contrast. Multiplanar CT image reconstructions and MIPs were obtained to evaluate the vascular anatomy. Carotid stenosis measurements (when applicable) are obtained utilizing NASCET criteria, using the distal internal carotid diameter as the denominator. CONTRAST:  77m OMNIPAQUE IOHEXOL 350 MG/ML SOLN COMPARISON:  Chest radiographs today.  Chest CTA 09/07/2020. FINDINGS: CT HEAD Brain: Cerebral volume is within normal limits. No midline shift, ventriculomegaly, mass effect, evidence of mass lesion, intracranial hemorrhage or evidence of cortically based acute infarction. Gray-white matter differentiation is within normal limits throughout the brain. Partially empty sella, nonspecific. Calvarium and skull base: No acute osseous abnormality identified. Paranasal sinuses: Visualized paranasal sinuses and mastoids are clear. Orbits: Visualized orbits and scalp soft tissues are within normal limits. CTA NECK Skeleton: Mild degenerative appearing anterolisthesis at C4-C5 with associated facet arthropathy greater on the right. Chronic disc and endplate degeneration  at the level below that. No acute or suspicious osseous lesion identified. Upper chest: Upper lungs remain clear. Other neck: Symmetric larynx and pharynx. No neck mass, lymphadenopathy, or acute soft tissue finding identified. Aortic arch: 4 vessel arch configuration. The left vertebral artery arises directly from the arch. No arch atherosclerosis. Right carotid system: Negative aside from a mildly beaded appearance of the cervical right ICA on series 16, image 23, raising the possibility of Fibromuscular Dysplasia (FMD). Left carotid system: Negative. Vertebral arteries: Proximal right subclavian artery and right vertebral artery origin are normal. The right vertebral artery is dominant and normal to the skull base. Non dominant left vertebral artery arises directly from the arch with no atherosclerosis or  stenosis. The left vertebral remains non dominant but patent to the skull base. CTA HEAD Posterior circulation: Dominant right V4 segment primarily supplies the basilar. Patent PICA origins, the left V4 is diminutive beyond PICA. Patent basilar artery without stenosis. Patent SCA and PCA origins. Posterior communicating arteries are diminutive or absent. Bilateral PCA branches are within normal limits. Anterior circulation: Both ICA siphons are patent with no plaque or stenosis. Normal ophthalmic artery origins. Patent carotid termini. Normal MCA and ACA origins. Anterior communicating artery and bilateral ACA branches are within normal limits. Left MCA M1 segment and bifurcation are patent without stenosis. Right MCA M1 segment and bifurcation are patent without stenosis. Bilateral MCA branches are within normal limits. Venous sinuses: Patent. Anatomic variants: Dominant right vertebral artery that primarily supplies the basilar. Non dominant left vertebral arises directly from the arch and functionally terminates in PICA. No early abnormal Other findings: Intracranial enhancement is identified. Review of the MIP images confirms the above findings IMPRESSION: 1. 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 2. Negative CT appearance of the brain. Note that MRI without and with contrast would be most sensitive for metastatic disease. Electronically Signed   By: Genevie Ann M.D.   On: 09/16/2020 06:29    Procedures Procedures   Medications Ordered in ED Medications  lactated ringers bolus 1,000 mL (1,000 mLs Intravenous New Bag/Given 09/16/20 0420)  iohexol (OMNIPAQUE) 350 MG/ML injection 100 mL (60 mLs Intravenous Contrast Given 09/16/20 0548)    ED Course  I have reviewed the triage vital signs and the nursing notes.  Pertinent labs & imaging results that were available during my care of the patient were reviewed by me  and considered in my medical decision making (see chart for details).    MDM Rules/Calculators/A&P                         Eval for any neurologic causes for symptoms is negative. Symptoms resolved for multiple hours. Tolerating PO. No new symptoms. Husband at bedside. Stable for discharge with PCP/onc/Pulm/surgery follow up.    Final Clinical Impression(s) / ED Diagnoses Final diagnoses:  None    Rx / DC Orders ED Discharge Orders     None        Toluwani Yadav, Corene Cornea, MD 09/16/20 [phone]

## 2020-09-16 NOTE — ED Triage Notes (Signed)
Pt arrives pov, was seen here earlier today, walked after leaving and is reports LLE tingling and feeling "hot." R eye feels "tight, pressure". Pt denies vision disturbance, denies CP, endorses feeling "anxious"

## 2020-09-16 NOTE — ED Notes (Signed)
Patient had bronchoscopy two days ago pre-op today for surgery to remove lung mass. Went for walk last night around 1900 and legs went numb and starting tingling. Went home and to bed. Stated around 2245 started having SOB and difficulty swallowing. States now symptoms have lessoned but still present. Patient lying on stretcher with ABCs intact, alert and oriented x4, respirations even and unlabored.

## 2020-09-16 NOTE — Telephone Encounter (Signed)
Pt was seen in the ED last night for a mass on her lung. She wanted to know if she needed to follow up with you since going to the ED

## 2020-09-16 NOTE — Telephone Encounter (Signed)
Pt called back and said that she would also try Pulmonary

## 2020-09-16 NOTE — ED Notes (Signed)
Patient sitting on stretcher with ABCs intact, alert and oriented x4, respirations even and unlabored. Patient states she feels better, drinking soda without problems, denies pain. No needs at this time, call bell in reach, stretcher in low, locked position with side rails up x2, family in room.

## 2020-09-16 NOTE — ED Provider Notes (Signed)
[facility] DEPT Provider Note   CSN: [id number] Arrival date & time: 09/16/20  1307     History Chief Complaint  Patient presents with   Leg Pain    Chlorene Anders Douthat is a 55 y.o. female.  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 Bx completed and pending. Presenting to ED this afternoon for tingling, flushing, and feeling warm after going on walk. Patient denies any sensation or motor loss. Denies slurred speech or facial asymtry. Patient seen here in ED last night for similar symptoms and had negative CT head and CTA head and neck.   The history is provided by the patient. No language interpreter was used.  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   [place]18[place]    Past Surgical History:  Procedure Laterality Date   BREAST LUMPECTOMY Right 06/14/2010   right   CESAREAN SECTION     1999/2002   FINE NEEDLE ASPIRATION  09/14/2020   Procedure: FINE NEEDLE ASPIRATION (FNA) LINEAR;  Surgeon: IGarner Nash DO;  [facility];  Service: Pulmonary;;   VIDEO BRONCHOSCOPY WITH ENDOBRONCHIAL ULTRASOUND Right 09/14/2020   Procedure: VIDEO BRONCHOSCOPY WITH ENDOBRONCHIAL ULTRASOUND;  Surgeon: IGarner Nash DO;  [facility]  Service: Pulmonary;  Laterality: Right;     OB History   No obstetric history on file.     Family History  Problem Relation Age of Onset   Diverticulitis  Mother     Social History   Tobacco Use   Smoking status: Never   Smokeless tobacco: Never  Vaping Use   Vaping Use: Never used  Substance Use Topics   Alcohol use: Yes    Comment: 5-7glasses of wine/ week   Drug use: Never    Home Medications Prior to Admission medications   Medication Sig Start Date End Date Taking? Authorizing Provider  Multiple Vitamins-Minerals (MULTIVITAMIN/EXTRA VITAMIN D3 PO) Take 1 tablet by mouth daily.   Yes [provider]    Allergies    Elemental sulfur  Review of Systems   Review of Systems  Constitutional:  Negative for chills and fever.  HENT:  Negative for ear pain and sore throat.   Eyes:  Negative for pain and visual disturbance.  Respiratory:  Negative for cough and shortness of breath.   Cardiovascular:  Negative for chest pain and palpitations.  Gastrointestinal:  Negative for abdominal pain and vomiting.  Genitourinary:  Negative for dysuria and hematuria.  Musculoskeletal:  Negative for arthralgias and back pain.  Skin:  Negative for color change and rash.  Neurological:  Negative for seizures and syncope.  All other systems reviewed and are negative.  Physical Exam Updated Vital Signs BP 124/86 (BP Location: Right Arm)   Pulse 65   Temp 97.9 F (36.6 C) (Oral)   Resp 16   Ht '5\' 4"'$  (  1.626 m)   Wt 62.6 kg   SpO2 100%   BMI 23.69 kg/m   Physical Exam Vitals and nursing note reviewed.  Constitutional:      General: She is not in acute distress.    Appearance: She is well-developed.  HENT:     Head: Normocephalic and atraumatic.  Eyes:     Conjunctiva/sclera: Conjunctivae normal.  Cardiovascular:     Rate and Rhythm: Normal rate and regular rhythm.     Pulses:          Dorsalis pedis pulses are 2+ on the right side and 2+ on the left side.     Heart sounds: No murmur heard. Pulmonary:     Effort: Pulmonary effort is normal. No respiratory distress.     Breath sounds: Normal breath sounds.  Abdominal:      Palpations: Abdomen is soft.     Tenderness: There is no abdominal tenderness.  Musculoskeletal:     Cervical back: Neck supple.     Right lower leg: Normal.     Left lower leg: Normal.  Skin:    General: Skin is warm and dry.  Neurological:     Mental Status: She is alert.     GCS: GCS eye subscore is 4. GCS verbal subscore is 5. GCS motor subscore is 6.     Sensory: Sensation is intact.     Motor: Motor function is intact.    ED Results / Procedures / Treatments   Labs (all labs ordered are listed, but only abnormal results are displayed) Labs Reviewed - No data to display  EKG None  Radiology CT Angio Head W or Wo Contrast  Result Date: 09/16/2020 CLINICAL DATA:  55 year old female with bilateral lower extremity numbness onset 2000 hours. Shortness of breath and difficulty swallowing. Right hilar/lung mass on CTA last month. EXAM: CT ANGIOGRAPHY HEAD AND NECK TECHNIQUE: Multidetector CT imaging of the head and neck was performed using the standard protocol during bolus administration of intravenous contrast. Multiplanar CT image reconstructions and MIPs were obtained to evaluate the vascular anatomy. Carotid stenosis measurements (when applicable) are obtained utilizing NASCET criteria, using the distal internal carotid diameter as the denominator. CONTRAST:  52m OMNIPAQUE IOHEXOL 350 MG/ML SOLN COMPARISON:  Chest radiographs today.  Chest CTA 09/07/2020. FINDINGS: CT HEAD Brain: Cerebral volume is within normal limits. No midline shift, ventriculomegaly, mass effect, evidence of mass lesion, intracranial hemorrhage or evidence of cortically based acute infarction. Winslow Verrill-white matter differentiation is within normal limits throughout the brain. Partially empty sella, nonspecific. Calvarium and skull base: No acute osseous abnormality identified. Paranasal sinuses: Visualized paranasal sinuses and mastoids are clear. Orbits: Visualized orbits and scalp soft tissues are within normal  limits. CTA NECK Skeleton: Mild degenerative appearing anterolisthesis at C4-C5 with associated facet arthropathy greater on the right. Chronic disc and endplate degeneration at the level below that. No acute or suspicious osseous lesion identified. Upper chest: Upper lungs remain clear. Other neck: Symmetric larynx and pharynx. No neck mass, lymphadenopathy, or acute soft tissue finding identified. Aortic arch: 4 vessel arch configuration. The left vertebral artery arises directly from the arch. No arch atherosclerosis. Right carotid system: Negative aside from a mildly beaded appearance of the cervical right ICA on series 16, image 23, raising the possibility of Fibromuscular Dysplasia (FMD). Left carotid system: Negative. Vertebral arteries: Proximal right subclavian artery and right vertebral artery origin are normal. The right vertebral artery is dominant and normal to the skull base. Non dominant left vertebral  artery arises directly from the arch with no atherosclerosis or stenosis. The left vertebral remains non dominant but patent to the skull base. CTA HEAD Posterior circulation: Dominant right V4 segment primarily supplies the basilar. Patent PICA origins, the left V4 is diminutive beyond PICA. Patent basilar artery without stenosis. Patent SCA and PCA origins. Posterior communicating arteries are diminutive or absent. Bilateral PCA branches are within normal limits. Anterior circulation: Both ICA siphons are patent with no plaque or stenosis. Normal ophthalmic artery origins. Patent carotid termini. Normal MCA and ACA origins. Anterior communicating artery and bilateral ACA branches are within normal limits. Left MCA M1 segment and bifurcation are patent without stenosis. Right MCA M1 segment and bifurcation are patent without stenosis. Bilateral MCA branches are within normal limits. Venous sinuses: Patent. Anatomic variants: Dominant right vertebral artery that primarily supplies the basilar. Non  dominant left vertebral arises directly from the arch and functionally terminates in PICA. No early abnormal Other findings: Intracranial enhancement is identified. Review of the MIP images confirms the above findings IMPRESSION: 1. 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 2. Negative CT appearance of the brain. Note that MRI without and with contrast would be most sensitive for metastatic disease. Electronically Signed   By: Genevie Ann M.D.   On: 09/16/2020 06:29   DG Chest 2 View  Result Date: 09/16/2020 CLINICAL DATA:  55 year old female with bilateral lower extremity numbness onset 2000 hours. Shortness of breath and difficulty swallowing. Right hilar/lung mass on CTA last month. EXAM: CHEST - 2 VIEW COMPARISON:  CTA chest 09/07/2020 and earlier. FINDINGS: Abnormal right hilum redemonstrated with mild associated streaky perihilar opacity stable from the recent CTA. Other mediastinal contours are within normal limits. Visualized tracheal air column is within normal limits. No pneumothorax, pulmonary edema, pleural effusion or other confluent pulmonary opacity. Superimposed right breast surgical clips. No acute osseous abnormality identified. Negative visible bowel gas. IMPRESSION: 1. Right hilar Mass with associated streaky perihilar right lung opacity stable from the recent Chest CTA. Favor Bronchogenic Carcinoma and recommend PET-CT, referral to [facility]). 2. No new cardiopulmonary abnormality. Electronically Signed   By: Genevie Ann M.D.   On: 09/16/2020 06:21   CT Angio Neck W and/or Wo Contrast  Result Date: 09/16/2020 CLINICAL DATA:  55 year old female with bilateral lower extremity numbness onset 2000 hours. Shortness of breath and difficulty swallowing. Right hilar/lung mass on CTA last month. EXAM: CT ANGIOGRAPHY HEAD AND NECK TECHNIQUE: Multidetector CT imaging of the  head and neck was performed using the standard protocol during bolus administration of intravenous contrast. Multiplanar CT image reconstructions and MIPs were obtained to evaluate the vascular anatomy. Carotid stenosis measurements (when applicable) are obtained utilizing NASCET criteria, using the distal internal carotid diameter as the denominator. CONTRAST:  15m OMNIPAQUE IOHEXOL 350 MG/ML SOLN COMPARISON:  Chest radiographs today.  Chest CTA 09/07/2020. FINDINGS: CT HEAD Brain: Cerebral volume is within normal limits. No midline shift, ventriculomegaly, mass effect, evidence of mass lesion, intracranial hemorrhage or evidence of cortically based acute infarction. Taiki Buckwalter-white matter differentiation is within normal limits throughout the brain. Partially empty sella, nonspecific. Calvarium and skull base: No acute osseous abnormality identified. Paranasal sinuses: Visualized paranasal sinuses and mastoids are clear. Orbits: Visualized orbits and scalp soft tissues are within normal limits. CTA NECK Skeleton: Mild degenerative appearing anterolisthesis at C4-C5 with associated facet arthropathy greater on the right. Chronic disc and endplate degeneration  at the level below that. No acute or suspicious osseous lesion identified. Upper chest: Upper lungs remain clear. Other neck: Symmetric larynx and pharynx. No neck mass, lymphadenopathy, or acute soft tissue finding identified. Aortic arch: 4 vessel arch configuration. The left vertebral artery arises directly from the arch. No arch atherosclerosis. Right carotid system: Negative aside from a mildly beaded appearance of the cervical right ICA on series 16, image 23, raising the possibility of Fibromuscular Dysplasia (FMD). Left carotid system: Negative. Vertebral arteries: Proximal right subclavian artery and right vertebral artery origin are normal. The right vertebral artery is dominant and normal to the skull base. Non dominant left vertebral artery arises  directly from the arch with no atherosclerosis or stenosis. The left vertebral remains non dominant but patent to the skull base. CTA HEAD Posterior circulation: Dominant right V4 segment primarily supplies the basilar. Patent PICA origins, the left V4 is diminutive beyond PICA. Patent basilar artery without stenosis. Patent SCA and PCA origins. Posterior communicating arteries are diminutive or absent. Bilateral PCA branches are within normal limits. Anterior circulation: Both ICA siphons are patent with no plaque or stenosis. Normal ophthalmic artery origins. Patent carotid termini. Normal MCA and ACA origins. Anterior communicating artery and bilateral ACA branches are within normal limits. Left MCA M1 segment and bifurcation are patent without stenosis. Right MCA M1 segment and bifurcation are patent without stenosis. Bilateral MCA branches are within normal limits. Venous sinuses: Patent. Anatomic variants: Dominant right vertebral artery that primarily supplies the basilar. Non dominant left vertebral arises directly from the arch and functionally terminates in PICA. No early abnormal Other findings: Intracranial enhancement is identified. Review of the MIP images confirms the above findings IMPRESSION: 1. 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 2. Negative CT appearance of the brain. Note that MRI without and with contrast would be most sensitive for metastatic disease. Electronically Signed   By: Genevie Ann M.D.   On: 09/16/2020 06:29    Procedures Procedures   Medications Ordered in ED Medications - No data to display  ED Course  I have reviewed the triage vital signs and the nursing notes.  Pertinent labs & imaging results that were available during my care of the patient were reviewed by me and considered in my medical decision making (see chart for details).    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 Bx completed and pending. Presenting to ED this afternoon for tingling, flushing, and feeling warm after going on walk.   -No neurovascular deficits on exam. -Pt seen last night in ED for similar symptoms with negative CTH and CT head and neck. -Patient's symptoms consistent with carcinoid tumor.  Patient in no distress and overall condition improved here in the ED. Detailed discussions were had with the patient regarding current findings, and need for close f/u with pulmonologist and cardiothoracic surgeon. The patient has been instructed to return immediately if the symptoms worsen in any way for re-evaluation. Patient verbalized understanding and is in agreement with current care plan. All questions answered prior to discharge.       Final Clinical Impression(s) / ED Diagnoses Final diagnoses:  Paresthesias of face and lower extremity  Lung tumor-with concerns for carcinoid tumor  Flushing    Rx / DC Orders ED Discharge Orders     None        Lianne Cure, DO [id number]

## 2020-09-16 NOTE — ED Triage Notes (Signed)
Patient presents with complaints of bilateral lower extremity numbness while walking in neighborhood states onset 2000 last pm; states numbness worse in left lower extremity; states after episode of numbness she had shortness of breath and difficultly swallowing. NAD noted; ambulatory to triage with steady gait.

## 2020-09-17 ENCOUNTER — Institutional Professional Consult (permissible substitution): Payer: Managed Care, Other (non HMO) | Admitting: Emergency Medicine

## 2020-09-17 LAB — PULMONARY FUNCTION TEST
DL/VA % pred: 147 %
DL/VA: 6.31 ml/min/mmHg/L
DLCO cor % pred: 137 %
DLCO cor: 28.39 ml/min/mmHg
DLCO unc % pred: 137 %
DLCO unc: 28.56 ml/min/mmHg
FEF 25-75 Post: 2.52 L/sec
FEF 25-75 Pre: 2.36 L/sec
FEF2575-%Change-Post: 7 %
FEF2575-%Pred-Post: 97 %
FEF2575-%Pred-Pre: 90 %
FEV1-%Change-Post: 3 %
FEV1-%Pred-Post: 89 %
FEV1-%Pred-Pre: 86 %
FEV1-Post: 2.43 L
FEV1-Pre: 2.36 L
FEV1FVC-%Change-Post: -1 %
FEV1FVC-%Pred-Pre: 103 %
FEV6-%Change-Post: 4 %
FEV6-%Pred-Post: 89 %
FEV6-%Pred-Pre: 85 %
FEV6-Post: 2.99 L
FEV6-Pre: 2.86 L
FEV6FVC-%Pred-Post: 103 %
FEV6FVC-%Pred-Pre: 103 %
FVC-%Change-Post: 4 %
FVC-%Pred-Post: 86 %
FVC-%Pred-Pre: 82 %
FVC-Post: 2.99 L
FVC-Pre: 2.86 L
Post FEV1/FVC ratio: 81 %
Post FEV6/FVC ratio: 100 %
Pre FEV1/FVC ratio: 82 %
Pre FEV6/FVC Ratio: 100 %
RV % pred: 91 %
RV: 1.71 L
TLC % pred: 93 %
TLC: 4.7 L

## 2020-09-17 LAB — T4: T4, Total: 9 ug/dL (ref 4.5–12.0)

## 2020-09-17 LAB — CYTOLOGY - NON PAP

## 2020-09-20 ENCOUNTER — Telehealth: Payer: Self-pay | Admitting: Pulmonary Disease

## 2020-09-20 DIAGNOSIS — C7A090 Malignant carcinoid tumor of the bronchus and lung: Secondary | ICD-10-CM

## 2020-09-20 NOTE — Progress Notes (Addendum)
[address]       [place] [address]             [phone]                    Sheyli P Coil Labette Medical Record [id number] Date of Birth: 1965-09-05  Referring: Garner Nash, DO Primary Care: Elby Showers, MD Primary Cardiologist: None  Chief Complaint:    Chief Complaint  Patient presents with   hilar mass    Surgical consult, CTA Chest 09/07/20, Bronch 09/07/20, PFT's 09/15/20    History of Present Illness:    [name] [name] 55 y.o. female referred by Dr. 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  She has a hx of right breast cancer, originally treated with partial mastectomy and radiation.  Originally, presented to ED with shortness of breath and CT showed the hilar mass.  She underwent bronch and biopsy with Dr. Valeta Harms, and the pathology showed carcinoid tumor.  She remains very active however she occasionally has some mild shortness of breath with extreme exertion.  Smoking Hx: Lifelong non-smoker   Zubrod Score: At the time of surgery this patient's most appropriate activity status/level should be described as: '[x]'$     0    Normal activity, no symptoms '[]'$     1    Restricted in physical strenuous activity but ambulatory, able to do out light work '[]'$     2    Ambulatory and capable of self care, unable to do work activities, up and about               >50 % of waking hours                              '[]'$     3    Only limited self care, in bed greater than 50% of waking hours '[]'$     4    Completely disabled, no self care, confined to bed or chair '[]'$     5    Moribund   Past Medical History:  Diagnosis Date   Breast cancer ([place])    stage 0 breast cancer s/p lumpectomy   History of radiation therapy 07/27/10-09/09/10   right breast 61GYtotal/76f   Personal history of radiation therapy    2012    Past Surgical History:  Procedure Laterality Date   BREAST LUMPECTOMY Right 06/14/2010   right    CESAREAN SECTION     1999/2002   FINE NEEDLE ASPIRATION  09/14/2020   Procedure: FINE NEEDLE ASPIRATION (FNA) LINEAR;  Surgeon: IGarner Nash DO;  [facility];  Service: Pulmonary;;   VIDEO BRONCHOSCOPY WITH ENDOBRONCHIAL ULTRASOUND Right 09/14/2020   Procedure: VIDEO BRONCHOSCOPY WITH ENDOBRONCHIAL ULTRASOUND;  Surgeon: IGarner Nash DO;  [facility]  Service: Pulmonary;  Laterality: Right;    Family History  Problem Relation Age of Onset   Diverticulitis Mother      Social History   Tobacco Use  Smoking Status Never  Smokeless Tobacco Never    Social History   Substance and Sexual Activity  Alcohol Use Yes   Comment: 5-7glasses of wine/ week     Allergies  Allergen Reactions   Elemental Sulfur Hives    Current Outpatient Medications  Medication Sig Dispense Refill   ALPRAZolam (XANAX) 0.5 MG tablet One half to one tablet up to 3  times daily as needed for anxiety. 30 tablet 0   Multiple Vitamins-Minerals (MULTIVITAMIN/EXTRA VITAMIN D3 PO) Take 1 tablet by mouth daily.     levothyroxine (SYNTHROID) 50 MCG tablet Take 1 tablet (50 mcg total) by mouth daily. (Patient not taking: Reported on 09/24/2020) 90 tablet 3   No current facility-administered medications for this visit.    Review of Systems  Constitutional: Negative.   Respiratory:  Positive for shortness of breath and wheezing. Negative for cough.   Cardiovascular:  Negative for chest pain.  Neurological: Negative.     PHYSICAL EXAMINATION: BP 120/84 (BP Location: Left Arm, Patient Position: Sitting)   Pulse 64   Resp 20   Ht '5\' 4"'$  (1.626 m)   Wt 133 lb (60.3 kg)   SpO2 99% Comment: RA  BMI 22.83 kg/m  Physical Exam Constitutional:      General: She is not in acute distress.    Appearance: Normal appearance. She is normal weight. She is not ill-appearing.  HENT:     Head: Normocephalic and atraumatic.  Cardiovascular:     Rate and Rhythm: Normal rate.     Heart sounds:  No murmur heard. Pulmonary:     Effort: Pulmonary effort is normal. No respiratory distress.     Breath sounds: Normal breath sounds.  Abdominal:     General: There is no distension.  Musculoskeletal:        General: Normal range of motion.     Cervical back: Normal range of motion.  Skin:    General: Skin is warm and dry.  Neurological:     General: No focal deficit present.     Mental Status: She is alert and oriented to person, place, and time.  Psychiatric:        Mood and Affect: Mood normal.        Behavior: Behavior normal.    Diagnostic Studies & Laboratory data:     Recent Radiology Findings:   CT Angio Head W or Wo Contrast  Result Date: 09/16/2020 CLINICAL DATA:  55 year old female with bilateral lower extremity numbness onset 2000 hours. Shortness of breath and difficulty swallowing. Right hilar/lung mass on CTA last month. EXAM: CT ANGIOGRAPHY HEAD AND NECK TECHNIQUE: Multidetector CT imaging of the head and neck was performed using the standard protocol during bolus administration of intravenous contrast. Multiplanar CT image reconstructions and MIPs were obtained to evaluate the vascular anatomy. Carotid stenosis measurements (when applicable) are obtained utilizing NASCET criteria, using the distal internal carotid diameter as the denominator. CONTRAST:  74m OMNIPAQUE IOHEXOL 350 MG/ML SOLN COMPARISON:  Chest radiographs today.  Chest CTA 09/07/2020. FINDINGS: CT HEAD Brain: Cerebral volume is within normal limits. No midline shift, ventriculomegaly, mass effect, evidence of mass lesion, intracranial hemorrhage or evidence of cortically based acute infarction. Gray-white matter differentiation is within normal limits throughout the brain. Partially empty sella, nonspecific. Calvarium and skull base: No acute osseous abnormality identified. Paranasal sinuses: Visualized paranasal sinuses and mastoids are clear. Orbits: Visualized orbits and scalp soft tissues are within normal  limits. CTA NECK Skeleton: Mild degenerative appearing anterolisthesis at C4-C5 with associated facet arthropathy greater on the right. Chronic disc and endplate degeneration at the level below that. No acute or suspicious osseous lesion identified. Upper chest: Upper lungs remain clear. Other neck: Symmetric larynx and pharynx. No neck mass, lymphadenopathy, or acute soft tissue finding identified. Aortic arch: 4 vessel arch configuration. The left vertebral artery arises directly from the arch. No arch atherosclerosis. Right carotid system:  Negative aside from a mildly beaded appearance of the cervical right ICA on series 16, image 23, raising the possibility of Fibromuscular Dysplasia (FMD). Left carotid system: Negative. Vertebral arteries: Proximal right subclavian artery and right vertebral artery origin are normal. The right vertebral artery is dominant and normal to the skull base. Non dominant left vertebral artery arises directly from the arch with no atherosclerosis or stenosis. The left vertebral remains non dominant but patent to the skull base. CTA HEAD Posterior circulation: Dominant right V4 segment primarily supplies the basilar. Patent PICA origins, the left V4 is diminutive beyond PICA. Patent basilar artery without stenosis. Patent SCA and PCA origins. Posterior communicating arteries are diminutive or absent. Bilateral PCA branches are within normal limits. Anterior circulation: Both ICA siphons are patent with no plaque or stenosis. Normal ophthalmic artery origins. Patent carotid termini. Normal MCA and ACA origins. Anterior communicating artery and bilateral ACA branches are within normal limits. Left MCA M1 segment and bifurcation are patent without stenosis. Right MCA M1 segment and bifurcation are patent without stenosis. Bilateral MCA branches are within normal limits. Venous sinuses: Patent. Anatomic variants: Dominant right vertebral artery that primarily supplies the basilar. Non  dominant left vertebral arises directly from the arch and functionally terminates in PICA. No early abnormal Other findings: Intracranial enhancement is identified. Review of the MIP images confirms the above findings IMPRESSION: 1. 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 2. Negative CT appearance of the brain. Note that MRI without and with contrast would be most sensitive for metastatic disease. Electronically Signed   By: Genevie Ann M.D.   On: 09/16/2020 06:29   DG Chest 2 View  Result Date: 09/16/2020 CLINICAL DATA:  55 year old female with bilateral lower extremity numbness onset 2000 hours. Shortness of breath and difficulty swallowing. Right hilar/lung mass on CTA last month. EXAM: CHEST - 2 VIEW COMPARISON:  CTA chest 09/07/2020 and earlier. FINDINGS: Abnormal right hilum redemonstrated with mild associated streaky perihilar opacity stable from the recent CTA. Other mediastinal contours are within normal limits. Visualized tracheal air column is within normal limits. No pneumothorax, pulmonary edema, pleural effusion or other confluent pulmonary opacity. Superimposed right breast surgical clips. No acute osseous abnormality identified. Negative visible bowel gas. IMPRESSION: 1. Right hilar Mass with associated streaky perihilar right lung opacity stable from the recent Chest CTA. Favor Bronchogenic Carcinoma and recommend PET-CT, referral to [facility]). 2. No new cardiopulmonary abnormality. Electronically Signed   By: Genevie Ann M.D.   On: 09/16/2020 06:21   DG Chest 2 View  Result Date: 09/06/2020 CLINICAL DATA:  Shortness of breath. EXAM: CHEST - 2 VIEW COMPARISON:  Chest radiograph dated 11/02/2012. FINDINGS: No focal consolidation, pleural effusion or pneumothorax. The cardiac silhouette is within limits. No acute osseous pathology. Surgical clips of the anterior right  chest wall. IMPRESSION: No active cardiopulmonary disease. Electronically Signed   By: Anner Crete M.D.   On: 09/06/2020 23:30   CT Angio Neck W and/or Wo Contrast  Result Date: 09/16/2020 CLINICAL DATA:  55 year old female with bilateral lower extremity numbness onset 2000 hours. Shortness of breath and difficulty swallowing. Right hilar/lung mass on CTA last month. EXAM: CT ANGIOGRAPHY HEAD AND NECK TECHNIQUE: Multidetector CT imaging of the head and neck was performed using the standard protocol during bolus administration of intravenous contrast. Multiplanar CT image reconstructions and MIPs were obtained to evaluate the vascular anatomy. Carotid  stenosis measurements (when applicable) are obtained utilizing NASCET criteria, using the distal internal carotid diameter as the denominator. CONTRAST:  25m OMNIPAQUE IOHEXOL 350 MG/ML SOLN COMPARISON:  Chest radiographs today.  Chest CTA 09/07/2020. FINDINGS: CT HEAD Brain: Cerebral volume is within normal limits. No midline shift, ventriculomegaly, mass effect, evidence of mass lesion, intracranial hemorrhage or evidence of cortically based acute infarction. Gray-white matter differentiation is within normal limits throughout the brain. Partially empty sella, nonspecific. Calvarium and skull base: No acute osseous abnormality identified. Paranasal sinuses: Visualized paranasal sinuses and mastoids are clear. Orbits: Visualized orbits and scalp soft tissues are within normal limits. CTA NECK Skeleton: Mild degenerative appearing anterolisthesis at C4-C5 with associated facet arthropathy greater on the right. Chronic disc and endplate degeneration at the level below that. No acute or suspicious osseous lesion identified. Upper chest: Upper lungs remain clear. Other neck: Symmetric larynx and pharynx. No neck mass, lymphadenopathy, or acute soft tissue finding identified. Aortic arch: 4 vessel arch configuration. The left vertebral artery arises directly from  the arch. No arch atherosclerosis. Right carotid system: Negative aside from a mildly beaded appearance of the cervical right ICA on series 16, image 23, raising the possibility of Fibromuscular Dysplasia (FMD). Left carotid system: Negative. Vertebral arteries: Proximal right subclavian artery and right vertebral artery origin are normal. The right vertebral artery is dominant and normal to the skull base. Non dominant left vertebral artery arises directly from the arch with no atherosclerosis or stenosis. The left vertebral remains non dominant but patent to the skull base. CTA HEAD Posterior circulation: Dominant right V4 segment primarily supplies the basilar. Patent PICA origins, the left V4 is diminutive beyond PICA. Patent basilar artery without stenosis. Patent SCA and PCA origins. Posterior communicating arteries are diminutive or absent. Bilateral PCA branches are within normal limits. Anterior circulation: Both ICA siphons are patent with no plaque or stenosis. Normal ophthalmic artery origins. Patent carotid termini. Normal MCA and ACA origins. Anterior communicating artery and bilateral ACA branches are within normal limits. Left MCA M1 segment and bifurcation are patent without stenosis. Right MCA M1 segment and bifurcation are patent without stenosis. Bilateral MCA branches are within normal limits. Venous sinuses: Patent. Anatomic variants: Dominant right vertebral artery that primarily supplies the basilar. Non dominant left vertebral arises directly from the arch and functionally terminates in PICA. No early abnormal Other findings: Intracranial enhancement is identified. Review of the MIP images confirms the above findings IMPRESSION: 1. 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 2. Negative CT appearance of the brain. Note that MRI without and with contrast would be most sensitive for metastatic disease.  Electronically Signed   By: HGenevie AnnM.D.   On: 09/16/2020 06:29   CT Angio Chest PE W and/or Wo Contrast  Result Date: 09/07/2020 CLINICAL DATA:  Concern for pulmonary embolism. EXAM: CT ANGIOGRAPHY CHEST WITH CONTRAST TECHNIQUE: Multidetector CT imaging of the chest was performed using the standard protocol during bolus administration of intravenous contrast. Multiplanar CT image reconstructions and MIPs were obtained to evaluate the vascular anatomy. CONTRAST:  838mOMNIPAQUE IOHEXOL 350 MG/ML SOLN COMPARISON:  Chest radiograph dated 09/06/2020. FINDINGS: Cardiovascular: There is no cardiomegaly or pericardial effusion. The thoracic aorta is unremarkable. The origins of the great vessels of the aortic arch appear patent. No pulmonary artery embolus identified. Mediastinum/Nodes: There is a 1.7 x 1.4 cm right hilar mass or adenopathy. No mediastinal adenopathy. The esophagus  is grossly unremarkable. No mediastinal fluid collection. Lungs/Pleura: Linear and streaky density extending from the right hilum along the right minor fissure may represent postobstructive atelectasis. Pneumonia is not excluded. Clinical correlation is recommended. There is no pleural effusion pneumothorax. There is mass effect and compression of the right middle lobe bronchus by the right hilar mass/adenopathy. Upper Abdomen: No acute abnormality. Musculoskeletal: Surgical clips in the right breast. No acute osseous pathology. Review of the MIP images confirms the above findings. IMPRESSION: 1. No CT evidence of pulmonary embolism. 2. Right hilar mass or adenopathy. Further evaluation with PET-CT is recommended. There is associated mass effect and compression of the right middle lobe bronchus. 3. Linear and streaky density extending from the right hilum along the right minor fissure may represent postobstructive atelectasis. Pneumonia is not excluded. Clinical correlation is recommended. Electronically Signed   By: Anner Crete M.D.    On: 09/07/2020 00:23   US BREAST LTD UNI LEFT INC AXILLA  Result Date: 08/31/2020 CLINICAL DATA:  54 year old female presenting with a new lump in the periareolar left breast. The patient is no longer able to feel the lump. EXAM: DIGITAL DIAGNOSTIC UNILATERAL LEFT MAMMOGRAM WITH TOMOSYNTHESIS AND CAD; ULTRASOUND LEFT BREAST LIMITED TECHNIQUE: Left digital diagnostic mammography and breast tomosynthesis was performed. The images were evaluated with computer-aided detection.; Targeted ultrasound examination of the left breast was performed. COMPARISON:  Previous exam(s). ACR Breast Density Category c: The breast tissue is heterogeneously dense, which may obscure small masses. FINDINGS: Mammogram: A skin BB marks the palpable site of concern reported by the patient the periareolar outer left breast. A spot tangential view of this area was performed in addition to standard views. There is no new abnormality at the palpable site or elsewhere in the left breast. On physical exam at the site of concern reported by the patient I do not feel a discrete mass or focal area of thickening. Ultrasound: Targeted ultrasound performed at the palpable site of concern in the left breast at 2-3 o'clock periareolar demonstrating no cystic or solid mass. IMPRESSION: No mammographic or sonographic evidence of malignancy at the palpable site of concern in the left breast. The patient is no longer able to feel the palpable area. RECOMMENDATION: 1. Recommend any further workup of the palpable area beyond a clinical basis. 2. Return for routine annual screening mammography which will be due in February 2023. I have discussed the findings and recommendations with the patient. If applicable, a reminder letter will be sent to the patient regarding the next appointment. BI-RADS CATEGORY  1: Negative. Electronically Signed   By: Audie Pinto M.D.   On: 08/31/2020 16:17  MM DIAG BREAST TOMO UNI LEFT  Result Date: 08/31/2020 CLINICAL  DATA:  55 year old female presenting with a new lump in the periareolar left breast. The patient is no longer able to feel the lump. EXAM: DIGITAL DIAGNOSTIC UNILATERAL LEFT MAMMOGRAM WITH TOMOSYNTHESIS AND CAD; ULTRASOUND LEFT BREAST LIMITED TECHNIQUE: Left digital diagnostic mammography and breast tomosynthesis was performed. The images were evaluated with computer-aided detection.; Targeted ultrasound examination of the left breast was performed. COMPARISON:  Previous exam(s). ACR Breast Density Category c: The breast tissue is heterogeneously dense, which may obscure small masses. FINDINGS: Mammogram: A skin BB marks the palpable site of concern reported by the patient the periareolar outer left breast. A spot tangential view of this area was performed in addition to standard views. There is no new abnormality at the palpable site or elsewhere in the left breast. On  physical exam at the site of concern reported by the patient I do not feel a discrete mass or focal area of thickening. Ultrasound: Targeted ultrasound performed at the palpable site of concern in the left breast at 2-3 o'clock periareolar demonstrating no cystic or solid mass. IMPRESSION: No mammographic or sonographic evidence of malignancy at the palpable site of concern in the left breast. The patient is no longer able to feel the palpable area. RECOMMENDATION: 1. Recommend any further workup of the palpable area beyond a clinical basis. 2. Return for routine annual screening mammography which will be due in February 2023. I have discussed the findings and recommendations with the patient. If applicable, a reminder letter will be sent to the patient regarding the next appointment. BI-RADS CATEGORY  1: Negative. Electronically Signed   By: Audie Pinto M.D.   On: 08/31/2020 16:17      I have independently reviewed the above radiology studies  and reviewed the findings with the patient.   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  RML is completely obstructed on broncho Hx of breast cancer on right.  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  On review of the imaging it is wedged between the takeoff of this to bronchi thus I think will be very difficult to perform a segmental resection.  It does appear far enough away from the upper lobe takeoff, thus I recommended that she undergo a bronchoscopy followed by a robotic assisted right middle lobe and right lower lobectomy.  Based off of her pulmonary function testing she should be able to tolerate this well.  A dotatate scan has been ordered, and they are waiting upon insurance approval.  I do not think that will show much distant disease, but since we are awaiting the results we we will schedule surgery after the scan has been performed.     I  spent 40 minutes with  the patient face to face in counseling and coordination of care.    Lajuana Matte 09/24/2020 1:33 PM

## 2020-09-20 NOTE — Telephone Encounter (Signed)
PCCM:  I called and spoke with the patients husband. Patient is doing much better now.   I have ordered a Ga-Doatate Scan to rule out metastatic disease from carcinoid due to her systemic symptoms she has been experiencing. But I think the risk of this would be very low.   Orders placed.   She has surgical consult on Friday with Dr. Kipp Brood and seeing Dr. Renold Genta tomorrow  Garner Nash, DO [facility] 09/20/2020 8:15 PM

## 2020-09-21 ENCOUNTER — Other Ambulatory Visit: Payer: Self-pay

## 2020-09-21 ENCOUNTER — Encounter: Payer: Self-pay | Admitting: Internal Medicine

## 2020-09-21 ENCOUNTER — Ambulatory Visit: Payer: Managed Care, Other (non HMO) | Admitting: Internal Medicine

## 2020-09-21 VITALS — BP 116/76 | HR 70 | Ht 64.0 in | Wt 132.0 lb

## 2020-09-21 DIAGNOSIS — F411 Generalized anxiety disorder: Secondary | ICD-10-CM | POA: Diagnosis not present

## 2020-09-21 DIAGNOSIS — D3A090 Benign carcinoid tumor of the bronchus and lung: Secondary | ICD-10-CM | POA: Diagnosis not present

## 2020-09-21 DIAGNOSIS — R946 Abnormal results of thyroid function studies: Secondary | ICD-10-CM

## 2020-09-21 LAB — T4, FREE: Free T4: 1.6 ng/dL (ref 0.8–1.8)

## 2020-09-21 LAB — TSH: TSH: 2.55 mIU/L

## 2020-09-21 LAB — T3, FREE: T3, Free: 2.9 pg/mL (ref 2.3–4.2)

## 2020-09-21 MED ORDER — LEVOTHYROXINE SODIUM 50 MCG PO TABS: 50.0000 ug | ORAL_TABLET | Freq: Every day | ORAL | 3 refills | Status: DC

## 2020-09-21 NOTE — Progress Notes (Signed)
   Subjective:    Patient ID: [name] [name], female    DOB: Jul 03, 1965, 55 y.o.   MRN: [id number]  HPI 55 year old Female seen for elevated TSH.  She has no prior history of hypothyroidism.  Was seen recently in the emergency department On September 8 with paresthesias of her face.  Had extensive evaluation including CT angio neck and hand with and without contrast which proved to be negative.  TSH was elevated at 8.519.  This will be repeated today along with free T4 and free T3 as well as TSH.  She is in the process of being evaluated for a right hilar mass detected on 09/06/2020 after presenting to the emergency department with shortness of breath.  Denied fever, chills, chest pain or cough.  Recently had flown to [facility] and was concerned about a pulmonary embolism.  Patient says Dr. Keturah Barre is a neighbor and was able to get her an appointment with Dr. Valeta Harms for evaluation of hilar mass.  He did video bronchoscopy on September 6 after seeing her in the office on August 31.  Patient went back to the emergency department on September 8 complaining of paresthesias of the face and lower extremities.  Complained of tingling flushing and feeling warm after going for a walk.  Had CT angio of brain with and without contrast showing no evidence of aneurysm.  There was mild fibromuscular dysplasia of the right internal carotid artery otherwise negative.  No brain abnormality noted.  We called Xanax in for her and scheduled a follow-up appointment for September 13.  Wanted to discuss elevated TSH with her.  The Xanax has helped.  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 Kipp Brood on September 16.  Remote history of breast cancer  2012.  Last seen here in March 2020 for health maintenance exam.   Review of Systems patient had been feeling well until recently.     Objective:   Physical Exam Vital signs reviewed.  No thyromegaly  appreciated.  Chest is clear to auscultation.  Cardiac exam: Regular rate and rhythm.  Affect thought and judgment are normal.       Assessment & Plan:  Elevated TSH on recent ED visit with no prior history of hypothyroidism  Carcinoid tumor of right lung-to see Dr. Kipp Brood soon regarding removal  Plan: TSH, free T4 and free T3 drawn.  Elevated TSH could perhaps have been an error.  She has no prior history of hypothyroidism.  May continue with Xanax as needed for anxiety.  Addendum: Thyroid functions are normal.  We will continue to observe and not treat her for hypothyroidism at this point in time as TSH is 2.55.  Certainly TSH  can be repeated in 3 months or so.  2 years ago his TSH was 3.51 and 3 years ago it was 3.86.  May continue with Xanax for anxiety if needed.  She will contact me if she has further concerns or questions.  Agree with consult with Dr. Kipp Brood who is excellent.

## 2020-09-22 NOTE — Telephone Encounter (Signed)
Let pt know that I am waiting for an authorization form pt's insurance so I can schedule this. Pt aware I will contact her once the authorization has been received.

## 2020-09-22 NOTE — Telephone Encounter (Signed)
Spoke w/ [employer] rep who advised that PET [phone]) is being reviewed by their medical board.

## 2020-09-22 NOTE — Telephone Encounter (Signed)
Dr. Valeta Harms placed a pet scan order 09/20/20. Called and spoke with Patient.  Patient stated Dr.Icard called earlier this week and spoke with Patient's husband. Dr. Valeta Harms  spoke about a scan he was ordering. Patient stated she had not been contacted to schedule. Advised Patient that our office PCC's work on [employer]  and scheduling scans. Advised I would send a message and someone would contact her once insurance has approved. Understanding stated.  Message routed to [facility]

## 2020-09-23 NOTE — Telephone Encounter (Signed)
Pt's case is still under review.  Made pt aware.

## 2020-09-24 ENCOUNTER — Institutional Professional Consult (permissible substitution): Payer: Managed Care, Other (non HMO) | Admitting: Thoracic Surgery (Cardiothoracic Vascular Surgery)

## 2020-09-24 ENCOUNTER — Other Ambulatory Visit: Payer: Self-pay

## 2020-09-24 ENCOUNTER — Encounter: Payer: Self-pay | Admitting: Thoracic Surgery (Cardiothoracic Vascular Surgery)

## 2020-09-24 VITALS — BP 120/84 | HR 64 | Resp 20 | Ht 64.0 in | Wt 133.0 lb

## 2020-09-24 DIAGNOSIS — R918 Other nonspecific abnormal finding of lung field: Secondary | ICD-10-CM | POA: Diagnosis not present

## 2020-09-24 NOTE — Telephone Encounter (Signed)
Pt made aware we are still waiting on PA as it is still under medical review.

## 2020-09-24 NOTE — Telephone Encounter (Addendum)
Pt initiated a phone call with [employer] to help determine what the hold up for the authorization for the PET Scan.  We spoke to [facility] w/ [place] who transferred the call to [facility] w/ [place].    Misha advised that the [id number] was denied & that a letter was faxed to me on 09/21/20 stating that this was denied.  I advised that I hadn't gotten the denial & asked for it to be faxed to me at [phone].  I still have not received the denial.  Misha stated that I could fax over lab results showing the pt had cancer or schedule a peer to peer.  I did both.  I have received a fax confirmation & the peer to peer has been scheduled with Dr. Valeta Harms for Monday, 09/27/2020 at 12:00.  Dr. Camie Patience will call my number, [phone] to initiate the Peer to Peer.  If for some reason the call does not come through, Dr. Valeta Harms can call [phone], option 4 to initiate the Peer to Peer.  Pt's Case# is [id number].

## 2020-09-24 NOTE — Telephone Encounter (Signed)
Prior Josem Kaufmann is still under review.

## 2020-09-24 NOTE — Patient Instructions (Addendum)
It was a pleasure to see you today.  Agree with consultation with Dr. Kipp Brood who is excellent.  Repeated TSH today as well as free T4 and free T3.  We will call you with results of these tests.  May take Xanax as needed for anxiety.   Addendum: TSH is normal. Suggest repeating TSH in 3 months and not starting any thyroid replacement medication at this time.

## 2020-09-27 ENCOUNTER — Other Ambulatory Visit: Payer: Self-pay

## 2020-09-27 ENCOUNTER — Telehealth: Payer: Self-pay | Admitting: Pulmonary Disease

## 2020-09-27 ENCOUNTER — Ambulatory Visit: Payer: Managed Care, Other (non HMO) | Admitting: Pulmonary Disease

## 2020-09-27 DIAGNOSIS — C7A090 Malignant carcinoid tumor of the bronchus and lung: Secondary | ICD-10-CM

## 2020-09-27 NOTE — Telephone Encounter (Signed)
Pulmonary critical care medicine:   I called and spoke with the patient's insurance company for peer to peer.  The insurance company has denied the gallium dotatate scan.  We will complete a CT abdomen pelvis for staging work-up.  I think if the CT abdomen pelvis is negative for any concern of additional metastatic disease she will be able to undergo surgery for the newly diagnosed pulmonary carcinoid.  Garner Nash, DO [facility] 09/27/2020 1:06 PM

## 2020-09-29 ENCOUNTER — Ambulatory Visit
Admission: RE | Admit: 2020-09-29 | Discharge: 2020-09-29 | Disposition: A | Discharge status: Home / Self Care | Payer: Managed Care, Other (non HMO) | Source: Ambulatory Visit | Attending: Pulmonary Disease | Admitting: Pulmonary Disease

## 2020-09-29 DIAGNOSIS — C7A090 Malignant carcinoid tumor of the bronchus and lung: Secondary | ICD-10-CM

## 2020-09-29 MED ORDER — IOPAMIDOL (ISOVUE-300) INJECTION 61%
100.0000 mL | Freq: Once | INTRAVENOUS | Status: AC | PRN
  Administered 2020-09-29: 100 mL via INTRAVENOUS

## 2020-10-01 ENCOUNTER — Encounter: Payer: Self-pay | Admitting: *Deleted

## 2020-10-01 ENCOUNTER — Other Ambulatory Visit: Payer: Self-pay | Admitting: *Deleted

## 2020-10-01 DIAGNOSIS — D3A090 Benign carcinoid tumor of the bronchus and lung: Secondary | ICD-10-CM

## 2020-10-03 NOTE — Progress Notes (Signed)
Triage, please let patient know I have reviewed her CT results. They look good. No barriers for her to proceed with surgery.   Thanks,  BLI  Garner Nash, DO [facility] 10/03/2020 8:09 PM

## 2020-10-04 NOTE — Progress Notes (Signed)
Spoke with pt and voices understanding. Pt did not have any further questions. She reports that Dr. Kipp Brood contacted her on Friday as well. She was appreciated of the call.

## 2020-10-08 ENCOUNTER — Other Ambulatory Visit: Payer: Self-pay

## 2020-10-08 ENCOUNTER — Ambulatory Visit
Admission: RE | Admit: 2020-10-08 | Discharge: 2020-10-08 | Disposition: A | Discharge status: Home / Self Care | Payer: Managed Care, Other (non HMO) | Source: Ambulatory Visit | Attending: Internal Medicine | Admitting: Internal Medicine

## 2020-10-08 ENCOUNTER — Telehealth: Payer: Self-pay | Admitting: Internal Medicine

## 2020-10-08 ENCOUNTER — Telehealth (INDEPENDENT_AMBULATORY_CARE_PROVIDER_SITE_OTHER): Payer: Managed Care, Other (non HMO) | Admitting: Internal Medicine

## 2020-10-08 ENCOUNTER — Other Ambulatory Visit (HOSPITAL_COMMUNITY): Payer: Managed Care, Other (non HMO)

## 2020-10-08 DIAGNOSIS — H5711 Ocular pain, right eye: Secondary | ICD-10-CM

## 2020-10-08 DIAGNOSIS — R519 Headache, unspecified: Secondary | ICD-10-CM

## 2020-10-08 NOTE — Telephone Encounter (Signed)
I have contacted patient by phone today. The office is closing due to inclement weather from [facility] and video capability is currently not available.  Patient was recently diagnosed with right hilar carcinoid tumor. Surgery to remove this tumor is planned for mid-October. Symptoms started in late August with SOB.    Patient called the office today complaining of right periorbital pressure in right eye and temple 5-6 times a day. She can take Xanax and get relief. Does have some on hand. No nausea,vomiting, or photophobia.No history of migraine headaches.  I have gotten approval from [facility] for MRI of brain. [facility] will be contacting her. She is stable at this point. May take Xanax for discomfort.  Patient is at her home and I am at my office.She is agreeable to speaking with me today by phone.  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  I can see her next week for evaluation in office.CBC in early September was normal. A sed rate could be done next week. Differential at this point seems to be tension headache vs migraine vs temporal arteritis. Not sure this is related to carcinoid at all.  Time spent reviewng records and interviewing patient by phone is 20 minutes

## 2020-10-08 NOTE — Telephone Encounter (Signed)
[name] [name] will let her know we need an MRI and can see her next week. I have ordered her MRI today STAT at [place]. We can see her next week. Her surgery for carcinoid is in 2 weeks.

## 2020-10-08 NOTE — Telephone Encounter (Signed)
I have scheduled appointment, can you check on MRI, see when it can be scheduled and if it needs prior authorization.

## 2020-10-08 NOTE — Telephone Encounter (Signed)
Appointment scheduled.

## 2020-10-08 NOTE — Telephone Encounter (Signed)
[name] [name] [phone]  [name] called to say that for the last couple of weeks she has been having pressure in her right eye and temple 5-6 times a day, she has mention to other doctors and they have not know what it was. Would it have anything to do with Carcimoid Tumor in her lung, should she get this checked out, she has not seen eye doctor.

## 2020-10-11 ENCOUNTER — Other Ambulatory Visit: Payer: Self-pay | Admitting: *Deleted

## 2020-10-11 NOTE — Telephone Encounter (Signed)
MRI completed 10/08/20

## 2020-10-12 ENCOUNTER — Ambulatory Visit: Payer: Managed Care, Other (non HMO) | Admitting: Internal Medicine

## 2020-10-12 ENCOUNTER — Other Ambulatory Visit: Payer: Self-pay

## 2020-10-12 ENCOUNTER — Encounter: Payer: Self-pay | Admitting: Internal Medicine

## 2020-10-12 VITALS — BP 102/70 | HR 75 | Temp 98.6°F | Ht 64.0 in | Wt 133.0 lb

## 2020-10-12 DIAGNOSIS — R519 Headache, unspecified: Secondary | ICD-10-CM

## 2020-10-12 DIAGNOSIS — Z23 Encounter for immunization: Secondary | ICD-10-CM

## 2020-10-12 DIAGNOSIS — R946 Abnormal results of thyroid function studies: Secondary | ICD-10-CM

## 2020-10-12 DIAGNOSIS — D3A090 Benign carcinoid tumor of the bronchus and lung: Secondary | ICD-10-CM

## 2020-10-12 NOTE — Progress Notes (Signed)
   Subjective:    Patient ID: [name] [name], female    DOB: Aug 19, 1965, 55 y.o.   MRN: [id number]  HPI 55 year old Female seen for follow up on elevated TSH and recent right periorbital headache.  She had an MRI of the brain last week which was normal.  She still has some mild residual right periorbital headache but is improving.  In early September, TSH was 8.519 and was repeated in mid September and result was 2.55.  I think the 8.519 value may be a lab error.  She will return on Thursday to have her labs drawn.  She has decided to have surgery for her lung carcinoid right lung at [facility] on October 17.  She has Xanax 0.5 mg on hand for anxiety.  Flu vaccine given today.    Review of Systems reports no visual disturbances     Objective:   Physical Exam Blood pressure 102/70 pulse 75 temperature 98.6 degrees pulse oximetry 97% weight 133 pounds BMI 22.83 extraocular movements are full.  PERRLA.  Cranial nerves II through XII are grossly intact.  Muscle strength is normal.  Affect thought and judgment appear to be normal.  Recent MRI showed no infarction, hemorrhage, mass, hydrocephalus.  She had a partially empty sella and mild atrophy.  Radiologist notes that partial empty sella can be a normal anatomic variant but sometimes can be associated with idiopathic intracranial hypertension.  No tumor is noted.  No stroke is noted.  I think we can continue to treat her headache conservatively at this time.       Assessment & Plan:  Protracted headache -perhaps this is a tension headache rather than a protracted migraine headache.  Can refer to Neurology if not improving  Anxiety state-has Xanax to take 1/2 to 1 tablet 0.5 mg up to 3 times daily as needed  Lung carcinoid-to have a surgery at [facility] October 17  Elevated TSH-repeat TSH in the near future.  Elevated TSH could have been an error.  Health maintenance-flu vaccine given

## 2020-10-12 NOTE — Patient Instructions (Signed)
Given recent elevated TSH, thyroid functions will be checked again.    Patient reassured about probable partial empty sella which should be of no consequence to her at this point in time.    Agree with surgical resection of lung carcinoid which is scheduled at [facility] October 17.  Has protracted headache-perhaps this is a tension periorbital headache rather than a protracted migraine.  Continue to monitor and treat conservatively.  Can refer to Neurology if not improving  Anxiety state treated with Xanax as needed  Health maintenance: Flu vaccine given

## 2020-10-14 ENCOUNTER — Other Ambulatory Visit: Payer: Managed Care, Other (non HMO) | Admitting: Internal Medicine

## 2020-10-14 ENCOUNTER — Other Ambulatory Visit: Payer: Self-pay

## 2020-10-14 DIAGNOSIS — R946 Abnormal results of thyroid function studies: Secondary | ICD-10-CM

## 2020-10-14 LAB — TSH: TSH: 4.98 mIU/L — ABNORMAL HIGH

## 2020-10-14 LAB — T4, FREE: Free T4: 1.1 ng/dL (ref 0.8–1.8)

## 2020-10-15 ENCOUNTER — Encounter: Payer: Self-pay | Admitting: Internal Medicine

## 2020-10-15 ENCOUNTER — Telehealth: Payer: Self-pay | Admitting: Internal Medicine

## 2020-10-15 NOTE — Telephone Encounter (Signed)
Patient is out of town. Left voice mail message that TSH is once again elevated. We can start low dose thyroid replacement since TSH has been elevated twice and follow up several weeks after surgery OR we can continue to monitor.

## 2020-10-18 NOTE — Telephone Encounter (Signed)
[name] [name] called to day and said she had not picked up new medication and she does not think she is going to. She does not want to start anything new until after her surgery on 10/25/2020. She will call us back after that.

## 2020-10-18 NOTE — Telephone Encounter (Signed)
No longer on patient medication list.

## 2020-10-22 ENCOUNTER — Other Ambulatory Visit (HOSPITAL_COMMUNITY): Payer: Managed Care, Other (non HMO)

## 2020-10-25 ENCOUNTER — Inpatient Hospital Stay: Admit: 2020-10-25 | Payer: Managed Care, Other (non HMO) | Admitting: Thoracic Surgery (Cardiothoracic Vascular Surgery)

## 2020-10-25 SURGERY — LOBECTOMY, LUNG, ROBOT-ASSISTED, USING VATS
Anesthesia: General | Site: Chest | Laterality: Right

## 2020-11-01 ENCOUNTER — Emergency Department (HOSPITAL_BASED_OUTPATIENT_CLINIC_OR_DEPARTMENT_OTHER): Payer: Managed Care, Other (non HMO)

## 2020-11-01 ENCOUNTER — Encounter (HOSPITAL_BASED_OUTPATIENT_CLINIC_OR_DEPARTMENT_OTHER): Payer: Self-pay

## 2020-11-01 ENCOUNTER — Emergency Department (HOSPITAL_BASED_OUTPATIENT_CLINIC_OR_DEPARTMENT_OTHER)
Admission: EM | Admit: 2020-11-01 | Discharge: 2020-11-01 | Disposition: A | Discharge status: Home / Self Care | Payer: Managed Care, Other (non HMO) | Attending: Emergency Medicine | Admitting: Emergency Medicine

## 2020-11-01 ENCOUNTER — Other Ambulatory Visit: Payer: Self-pay

## 2020-11-01 ENCOUNTER — Emergency Department (HOSPITAL_BASED_OUTPATIENT_CLINIC_OR_DEPARTMENT_OTHER): Payer: Managed Care, Other (non HMO) | Admitting: Radiology

## 2020-11-01 DIAGNOSIS — Z853 Personal history of malignant neoplasm of breast: Secondary | ICD-10-CM | POA: Insufficient documentation

## 2020-11-01 DIAGNOSIS — R0602 Shortness of breath: Secondary | ICD-10-CM | POA: Insufficient documentation

## 2020-11-01 LAB — BASIC METABOLIC PANEL
Anion gap: 9 (ref 5–15)
BUN: 9 mg/dL (ref 6–20)
CO2: 25 mmol/L (ref 22–32)
Calcium: 9.3 mg/dL (ref 8.9–10.3)
Chloride: 100 mmol/L (ref 98–111)
Creatinine, Ser: 0.46 mg/dL (ref 0.44–1.00)
GFR, Estimated: >60 mL/min (ref >60)
Glucose, Bld: 123 mg/dL — ABNORMAL HIGH (ref 70–99)
Potassium: 3.8 mmol/L (ref 3.5–5.1)
Sodium: 134 mmol/L — ABNORMAL LOW (ref 135–145)

## 2020-11-01 LAB — CBC
HCT: 34.8 % — ABNORMAL LOW (ref 36.0–46.0)
Hemoglobin: 11.8 g/dL — ABNORMAL LOW (ref 12.0–15.0)
MCH: 29.8 pg (ref 26.0–34.0)
MCHC: 33.9 g/dL (ref 30.0–36.0)
MCV: 87.9 fL (ref 80.0–100.0)
Platelets: 316 10*3/uL (ref 150–400)
RBC: 3.96 MIL/uL (ref 3.87–5.11)
RDW: 12.2 % (ref 11.5–15.5)
WBC: 7.1 10*3/uL (ref 4.0–10.5)
nRBC: 0 % (ref 0.0–0.2)

## 2020-11-01 LAB — D-DIMER, QUANTITATIVE: D-Dimer, Quant: 2.67 ug/mL-FEU — ABNORMAL HIGH (ref 0.00–0.50)

## 2020-11-01 LAB — PROTIME-INR
INR: 1 (ref 0.8–1.2)
Prothrombin Time: 13 seconds (ref 11.4–15.2)

## 2020-11-01 MED ORDER — IOHEXOL 350 MG/ML SOLN
100.0000 mL | Freq: Once | INTRAVENOUS | Status: AC | PRN
  Administered 2020-11-01: 61 mL via INTRAVENOUS

## 2020-11-01 MED ORDER — DOXYCYCLINE HYCLATE 100 MG PO CAPS: 100.0000 mg | ORAL_CAPSULE | Freq: Two times a day (BID) | ORAL | 0 refills | Status: AC

## 2020-11-01 NOTE — ED Triage Notes (Signed)
Had Right sided lobectomy done at [facility] on 10/25/20. Having shortness of breath w/chest pressure 6/10 intermittent.

## 2020-11-01 NOTE — ED Provider Notes (Signed)
[facility] EMERGENCY DEPT Provider Note   CSN: [id number] Arrival date & time: 11/01/20  1521     History Chief Complaint  Patient presents with   Shortness of Breath    [name] [name] is a 55 y.o. female presented emergency department with complaint of shortness of breath.  The patient reports that she had a right-sided lobectomy for localized lung cancer performed approximately 1 week ago at [facility]  She had been doing well at home after discharge.  She has been taking pain medication as prescribed.  She did report that this morning she felt she was having a harder time taking a breath in, felt some tightness and fullness in the right side of her lungs and towards her throat.  She called and spoke to her surgeon's office and they recommended that she come get evaluated in the ER, for possible blood clot.  She denies any prior history of DVT or PE, any unilateral leg swelling.  She is not on blood thinners.  She reports that she is otherwise healthy aside from this localized cancer which was treated.  She is not on active chemo or radiation therapy.  She denies any left sided chest pain, chest pressure on the left side, or any history of cardiac disease, MI, stents, hypertension, diabetes.  Currently while sitting at rest in the ER she reports that her symptoms are extremely minimal.  She does not feel very short of breath.  She denies any active chest pain.   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   Cancer of overlapping sites of right female breast ([place]) 07/18/2010    Past Surgical History:   Procedure Laterality Date   BREAST LUMPECTOMY Right 06/14/2010   right   CESAREAN SECTION     1999/2002   FINE NEEDLE ASPIRATION  09/14/2020   Procedure: FINE NEEDLE ASPIRATION (FNA) LINEAR;  Surgeon: Garner Nash, DO;  Location: [facility];  Service: Pulmonary;;   LOBECTOMY Right    10/2020   VIDEO BRONCHOSCOPY WITH ENDOBRONCHIAL ULTRASOUND Right 09/14/2020   Procedure: VIDEO BRONCHOSCOPY WITH ENDOBRONCHIAL ULTRASOUND;  Surgeon: Garner Nash, DO;  Location: [facility];  Service: Pulmonary;  Laterality: Right;     OB History   No obstetric history on file.     Family History  Problem Relation Age of Onset   Diverticulitis Mother     Social History   Tobacco Use   Smoking status: Never    Passive exposure: Never   Smokeless tobacco: Never  Vaping Use   Vaping Use: Never used  Substance Use Topics   Alcohol use: Yes    Comment: 5-7glasses of wine/ week   Drug use: Never    Home Medications Prior to Admission medications   Medication Sig Start Date End Date Taking? Authorizing Provider  doxycycline (VIBRAMYCIN) 100 MG capsule Take 1 capsule (100 mg total) by mouth 2 (two) times daily for 7 days. 11/01/20 11/08/20 Yes Betzabeth Derringer, Carola Rhine, MD  ALPRAZolam Duanne Moron) 0.5 MG tablet One half to one tablet up to 3  times daily as needed for anxiety. 09/16/20   Elby Showers, MD  Multiple Vitamins-Minerals (MULTIVITAMIN/EXTRA VITAMIN D3 PO) Take 1 tablet by mouth daily.    [provider]    Allergies    Elemental sulfur  Review of Systems   Review of Systems  Constitutional:  Negative for chills and fever.  HENT:  Negative for ear pain and sore throat.   Eyes:  Negative for pain and visual disturbance.  Respiratory:  Positive for shortness of breath. Negative for cough.   Cardiovascular:  Negative for chest pain and palpitations.  Gastrointestinal:  Negative for abdominal pain and vomiting.  Genitourinary:  Negative for dysuria and hematuria.   Musculoskeletal:  Negative for arthralgias and back pain.  Skin:  Negative for color change and rash.  Neurological:  Negative for syncope and headaches.  All other systems reviewed and are negative.  Physical Exam Updated Vital Signs BP 108/73 (BP Location: Right Arm)   Pulse 79   Temp 98.3 F (36.8 C) (Oral)   Resp 17   Ht 5\' 4"  (1.626 m)   Wt 59 kg   SpO2 100%   BMI 22.31 kg/m   Physical Exam Constitutional:      General: She is not in acute distress. HENT:     Head: Normocephalic and atraumatic.  Eyes:     Conjunctiva/sclera: Conjunctivae normal.     Pupils: Pupils are equal, round, and reactive to light.  Cardiovascular:     Rate and Rhythm: Normal rate and regular rhythm.  Pulmonary:     Effort: Pulmonary effort is normal. No respiratory distress.  Abdominal:     General: There is no distension.     Tenderness: There is no abdominal tenderness.  Skin:    General: Skin is warm and dry.  Neurological:     General: No focal deficit present.     Mental Status: She is alert and oriented to person, place, and time. Mental status is at baseline.  Psychiatric:        Mood and Affect: Mood normal.        Behavior: Behavior normal.    ED Results / Procedures / Treatments   Labs (all labs ordered are listed, but only abnormal results are displayed) Labs Reviewed  BASIC METABOLIC PANEL - Abnormal; Notable for the following components:      Result Value   Sodium 134 (*)    Glucose, Bld 123 (*)    All other components within normal limits  CBC - Abnormal; Notable for the following components:   Hemoglobin 11.8 (*)    HCT 34.8 (*)    All other components within normal limits  D-DIMER, QUANTITATIVE - Abnormal; Notable for the following components:   D-Dimer, Quant 2.67 (*)    All other components within normal limits  PROTIME-INR    EKG EKG Interpretation  Date/Time:  Monday November 01 2020 16:00:01 EDT Ventricular Rate:  90 PR Interval:  140 QRS  Duration: 72 QT Interval:  358 QTC Calculation: 437 R Axis:   9 Text Interpretation: Normal sinus rhythm Normal ECG Confirmed by Octaviano Glow [id number]) on 11/01/2020 4:41:05 PM  Radiology DG Chest 2 View  Result Date: 11/01/2020 CLINICAL DATA:  Shortness of breath and chest pressure., recent right lobectomy. EXAM: CHEST - 2 VIEW COMPARISON:  Chest radiograph dated September 16, 2020 FINDINGS: The heart size and mediastinal contours are within normal limits. Postsurgical changes of the right lung. No focal consolidation of appreciable pneumothorax. The visualized skeletal  structures are unremarkable. Subcutaneous soft tissue emphysema along the right chest wall. IMPRESSION: Postsurgical changes along the right chest wall and in the right lung. No focal consolidation or appreciable pneumothorax. Electronically Signed   By: Keane Police D.O.   On: 11/01/2020 16:51   CT Angio Chest PE W and/or Wo Contrast  Result Date: 11/01/2020 CLINICAL DATA:  PE suspected, high prob right sided lobectomy 1 week ago, here with new dyspnea, SOB, right sided pressure - evaluate for PE EXAM: CT ANGIOGRAPHY CHEST WITH CONTRAST TECHNIQUE: Multidetector CT imaging of the chest was performed using the standard protocol during bolus administration of intravenous contrast. Multiplanar CT image reconstructions and MIPs were obtained to evaluate the vascular anatomy. CONTRAST:  37mL OMNIPAQUE IOHEXOL 350 MG/ML SOLN COMPARISON:  None. FINDINGS: Cardiovascular: Satisfactory opacification of the pulmonary arteries to the segmental level. No evidence of pulmonary embolism. Normal heart size. No significant pericardial effusion. The thoracic aorta is normal in caliber. No atherosclerotic plaque of the thoracic aorta. No coronary artery calcifications. Mediastinum/Nodes: Trace anterior pneumomediastinum. No enlarged mediastinal, hilar, or axillary lymph nodes. Thyroid gland, trachea, and esophagus demonstrate no significant findings.  Lungs/Pleura: Status post right middle lobectomy. Biapical pleural/pulmonary scarring. No focal consolidation. No pulmonary nodule. No pulmonary mass. Trace right pleural effusion. No left pleural effusion. Loculated foci of gas within the right middle fissure. No pneumothorax. No bronchopleural fistula identified. Upper Abdomen: No acute abnormality. Musculoskeletal: Moderate to large volume right chest wall and back subcutaneus soft tissue edema. Trace subcutaneus soft tissue edema along the left chest wall. No suspicious lytic or blastic osseous lesions. No acute displaced fracture. Multilevel degenerative changes of the spine. Review of the MIP images confirms the above findings. IMPRESSION: 1. Moderate to large volume right chest wall and back subcutaneus soft tissue edema. Trace subcutaneus soft tissue edema along the left chest wall. Findings may be postsurgical; however, amount of air seems larger than expected for postop day 7. Recommend correlation with physical exam to exclude infection. 2. Trace anterior pneumomediastinum.  Findings may be postsurgical. 3. Loculated foci of gas noted within the right middle fissure with no definite pneumothorax. No definite bronchopleural fistula identified. 4. Interval development of a trace right pleural effusion. Electronically Signed   By: Iven Finn M.D.   On: 11/01/2020 19:28    Procedures Procedures   Medications Ordered in ED Medications  iohexol (OMNIPAQUE) 350 MG/ML injection 100 mL (61 mLs Intravenous Contrast Given 11/01/20 1737)    ED Course  I have reviewed the triage vital signs and the nursing notes.  Pertinent labs & imaging results that were available during my care of the patient were reviewed by me and considered in my medical decision making (see chart for details).  Differential diagnosis includes postoperative changes for small pleural effusion versus pneumonia versus pulmonary embolism versus other.  She is not hypoxic and  otherwise clinically well-appearing.  I doubt sepsis.  Doubt acute coronary syndrome.  I personally ordered, reviewed, interpreted her labs and imaging.  Blood work was unremarkable aside from ddimer elevated at 2.67, which may be normal following her surgery, but does raise some concern for thrombosis..  Chest x-ray and CT scan of the lungs per my interpretation showed no focal infiltrate or evidence of pulmonary embolism or pneumonia.  EKG per my interpretation shows sinus rhythm with no acute ischemic findings.  On my reassessment the patient is asymptomatic and wanting to go home.  Clinical Course as of 11/01/20 2335  Mon Nov 01, 2020  1947 Discussed CT image results with the patient.  There is some soft tissue edema which may be related to localized reaction the patient was told that she was having to the dressings on her wound.  She does have surrounding erythema but it does not streaking, she has no leukocytosis or fever, does not have other symptoms of cellulitis at this time.  I provided her a watch and wait prescription for doxycycline to have at home.  She has a follow-up appointment tomorrow with her surgeon for wound recheck [MT]    Clinical Course User Index [MT] Charlie Seda, Carola Rhine, MD    Final Clinical Impression(s) / ED Diagnoses Final diagnoses:  Shortness of breath    Rx / DC Orders ED Discharge Orders          Ordered    doxycycline (VIBRAMYCIN) 100 MG capsule  2 times daily        11/01/20 1947             Wyvonnia Dusky, MD 11/01/20 [phone]

## 2020-11-01 NOTE — Discharge Instructions (Signed)
Please follow-up with your specialist as scheduled tomorrow or this week.  If the redness is spreading down your chest away from your wound, or if you have worsening pain in the wound, or pus drainage, or begin having fevers, please start taking the antibiotic and call your specialist.

## 2020-11-03 ENCOUNTER — Telehealth: Payer: Self-pay

## 2020-11-03 NOTE — Telephone Encounter (Signed)
Transition Care Management Unsuccessful Follow-up Telephone Call  Date of discharge and from where:  10/28/20 from [facility]  Attempts:  1st Attempt  Reason for unsuccessful TCM follow-up call:  Left voice message    Thea Silversmith, RN, MSN, BSN, [place] Care Management Coordinator [phone]

## 2020-11-05 ENCOUNTER — Telehealth: Payer: Self-pay

## 2020-11-05 NOTE — Telephone Encounter (Signed)
Transition Care Management Unsuccessful Follow-up Telephone Call  Date of discharge and from where:  10/28/2020  [facility]  Attempts:  2nd Attempt  Reason for unsuccessful TCM follow-up call:  No answer/busy  Tomasa Rand, RN, BSN, CEN [place] Coordinator [phone]

## 2020-11-22 ENCOUNTER — Telehealth: Payer: Self-pay | Admitting: Internal Medicine

## 2020-11-22 NOTE — Telephone Encounter (Signed)
[name] [name] [phone]  [name] called to say she had her surgery on 10/25/2020, so she is wandering if she should have her thyroid check now. From the notes I have looked at, it looks like you wanted to check them again in the middle of December. She has not picked up or started any medication.

## 2020-11-23 NOTE — Telephone Encounter (Signed)
Scheduled

## 2020-12-13 ENCOUNTER — Other Ambulatory Visit: Payer: Self-pay

## 2020-12-13 ENCOUNTER — Other Ambulatory Visit: Payer: Managed Care, Other (non HMO) | Admitting: Internal Medicine

## 2020-12-13 DIAGNOSIS — R946 Abnormal results of thyroid function studies: Secondary | ICD-10-CM

## 2020-12-13 LAB — TSH: TSH: 4.72 mIU/L — ABNORMAL HIGH

## 2020-12-13 LAB — T4, FREE: Free T4: 1.1 ng/dL (ref 0.8–1.8)

## 2020-12-13 NOTE — Progress Notes (Signed)
Labs drawn

## 2020-12-14 ENCOUNTER — Ambulatory Visit (INDEPENDENT_AMBULATORY_CARE_PROVIDER_SITE_OTHER): Payer: Managed Care, Other (non HMO) | Admitting: Internal Medicine

## 2020-12-14 VITALS — BP 124/70 | HR 78 | Temp 97.9°F | Resp 16 | Ht 64.0 in | Wt 124.0 lb

## 2020-12-14 DIAGNOSIS — R946 Abnormal results of thyroid function studies: Secondary | ICD-10-CM | POA: Diagnosis not present

## 2020-12-14 DIAGNOSIS — F411 Generalized anxiety disorder: Secondary | ICD-10-CM

## 2020-12-14 DIAGNOSIS — Z86012 Personal history of benign carcinoid tumor: Secondary | ICD-10-CM

## 2020-12-14 DIAGNOSIS — E039 Hypothyroidism, unspecified: Secondary | ICD-10-CM | POA: Diagnosis not present

## 2020-12-14 DIAGNOSIS — R519 Headache, unspecified: Secondary | ICD-10-CM

## 2020-12-14 DIAGNOSIS — D3A090 Benign carcinoid tumor of the bronchus and lung: Secondary | ICD-10-CM

## 2020-12-14 MED ORDER — LEVOTHYROXINE SODIUM 50 MCG PO TABS: 50.0000 ug | ORAL_TABLET | Freq: Every day | ORAL | 3 refills | Status: AC

## 2020-12-14 NOTE — Progress Notes (Signed)
   Subjective:    Patient ID: [name] [name], female    DOB: 04-03-1965, 55 y.o.   MRN: [id number]  HPI 55 year old Female who recently underwent right middle lobectomy for carcinoid tumor  at [facility] and is recovering nicely.  She is feeling well postoperatively.  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  Tumor was intermediate grade 98% ER positive and 98% PR positive.  She underwent lumpectomy June 2012 to remove the lesion.  All margins were clear and she was started on tamoxifen.  History of cesarean sections 1999 and 2002.  She had menarche at age 53.  She is a non-smoker.  Social alcohol consumption.  She has a college degree.  Has worked as a Tree surgeon.  Husband is self-employed.  Family history: Sister in good health.  Parents in good health.  She is here today for follow-up on elevated TSH.  She has no cold intolerance.  Has some mild fatigue status post surgery.  No significantly dry skin but her TSH was noted to be elevated at 8.519 in September.  Was rechecked 5 days later and was 2.55.  On October 6 TSH was 4.98 and TSH now is 4.72.  Her free T4 is normal at 1.1.  I would like for her to start thyroid replacement medication and follow-up with repeat TSH and office visit in 6 weeks.  She will be started on levothyroxine 50 mcg daily to take on an empty stomach and with no other medication.  She is also been having some mild right maxillofacial pain.  If she does not think it is dental pain.  It seems to be along the right zygomatic arch.  It is not exquisitely tender to palpation.  She has no right TMJ popping or clicking.  Teeth do not appear to be tender to palpation in the right maxillary or right mandibular area.  Her pharynx is clear.  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  I think we can obtain a CT of the right maxillofacial area to make sure there are negative underlying tumors or abnormalities  causing this discomfort.  It does not seem like trigeminal neuralgia but that is a possibility I suppose.  She should probably check with her dentist as well to make sure there is not an underlying tooth abnormality.      Review of Systems-mild anxiety.  Has prescription for Xanax     Objective:   Physical Exam Vital signs are reviewed.  She is in no acute distress.  She has some tenderness along the right zygomatic arch.  There are no teeth along the right maxillary and right mandibular areas that are tender to touch.  Her pharynx is clear.  She has no cervical adenopathy.  No thyromegaly.       Assessment & Plan:   Status post right middle lobectomy for carcinoid tumor at [facility] and is recovering nicely  Right maxillary facial pain along the right zygomatic arch-would like to get limited CT of the maxillofacial area to see if there is any underlying tumor or bony deformity causing her symptoms.  Appointment scheduled at [place] December 21  Hypothyroidism -starting her on levothyroxine 50 mcg daily with follow-up in 6 weeks.

## 2020-12-19 ENCOUNTER — Encounter: Payer: Self-pay | Admitting: Internal Medicine

## 2020-12-19 DIAGNOSIS — Z86012 Personal history of benign carcinoid tumor: Secondary | ICD-10-CM | POA: Insufficient documentation

## 2020-12-19 NOTE — Patient Instructions (Signed)
Begin levothyroxine 50 mcg daily for primary hypothyroidism.  Will need follow-up office visit and TSH in 6 weeks.  Have ordered CT of right maxillofacial area due to pain that has been persistent for several weeks.  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  She has Xanax to take as needed for anxiety.

## 2020-12-21 ENCOUNTER — Telehealth: Payer: Self-pay | Admitting: Internal Medicine

## 2020-12-21 NOTE — Telephone Encounter (Signed)
[name] [name] [phone]  [name] called to say her lymph node under jaw on the left side is swollen and the right side is swollen a little not as much as the left she also has some bumps that started breaking out on her tongue yesterday, that are red with like a white pimple in the middle of them. She also started she had started on the new Thyroid medication on the 6th or 7th could not remember which day for sure, was wondering if these things could be coming from that. I told her that she needs to go to Urgent Care so they could look at these things, that you would not be back in office until maybe Thursday. She verbalized understanding.

## 2020-12-24 ENCOUNTER — Ambulatory Visit (INDEPENDENT_AMBULATORY_CARE_PROVIDER_SITE_OTHER): Payer: Managed Care, Other (non HMO) | Admitting: Internal Medicine

## 2020-12-24 ENCOUNTER — Other Ambulatory Visit: Payer: Self-pay

## 2020-12-24 ENCOUNTER — Encounter: Payer: Self-pay | Admitting: Internal Medicine

## 2020-12-24 VITALS — BP 100/70 | HR 80 | Temp 97.7°F | Ht 64.0 in | Wt 124.5 lb

## 2020-12-24 DIAGNOSIS — R519 Headache, unspecified: Secondary | ICD-10-CM | POA: Diagnosis not present

## 2020-12-24 DIAGNOSIS — E039 Hypothyroidism, unspecified: Secondary | ICD-10-CM

## 2020-12-24 DIAGNOSIS — D3A090 Benign carcinoid tumor of the bronchus and lung: Secondary | ICD-10-CM

## 2020-12-24 DIAGNOSIS — R946 Abnormal results of thyroid function studies: Secondary | ICD-10-CM

## 2020-12-24 MED ORDER — ALPRAZOLAM 0.5 MG PO TABS: 0.5000 mg | ORAL_TABLET | Freq: Every evening | ORAL | 0 refills | Status: DC | PRN

## 2020-12-24 MED ORDER — FLUCONAZOLE 150 MG PO TABS: 150.0000 mg | ORAL_TABLET | Freq: Once | ORAL | 0 refills | Status: AC

## 2020-12-24 MED ORDER — AZITHROMYCIN 250 MG PO TABS: ORAL_TABLET | ORAL | 0 refills | Status: DC

## 2020-12-24 NOTE — Progress Notes (Signed)
° °  Subjective:    Patient ID: [name] [name], female    DOB: 1965-09-23, 55 y.o.   MRN: [id number]  HPI Patient complaining of right cervical node tenderness. Has no sore throat or post nasal drip. Feels right maxillary sinus pressure. Was seen December 6th with right maxillofacial pain. Have ordered maxillofacial CT.  TSH was elevated in October at 4.98 and in September at 8.519.  It normalized mid-September to 2.55.  TSH was checked on December 5 and was 4.72.  I would like for her to start levothyroxine 50 mcg daily with follow-up in 6 to 8 weeks.  I do not think she has sinusitis but the pressure would make 1 think of right maxillary sinusitis so organ to try a Zithromax Z-PAK.  I have also given her some Xanax 0.5 mg to take at bedtime if needed for anxiety.  See extensive note on December 6.  She recently underwent right middle lobectomy for carcinoid tumor at [facility] and has recovered well.  History of ductal carcinoma in situ May 2012.  She underwent lumpectomy June 2012 to remove the lesion and all margins were clear and she was started on tamoxifen.  Non-smoker.  Social alcohol consumption.  We discussed whether or not the maxillofacial pain could be dental.  It seems to be right along the right zygomatic arch.  Review of Systems see above     Objective:   Physical Exam  Her oropharynx is clear and I do not see any caries.  She is tender along the right zygomatic arch.  There is no erythema of the face.  Right TM is clear.  No significant TMJ symptoms or popping.      Assessment & Plan:  Right zygomatic arch pain  Plan: We will get CT of maxillofacial area.  If symptoms persist, refer to ENT.  I think there is an element of anxiety.  She has Xanax to take.  I do not appreciate any significant adenopathy in her right neck.  She will take Zithromax Z-PAK to see if this helps.

## 2020-12-24 NOTE — Telephone Encounter (Signed)
Appt made for today

## 2020-12-24 NOTE — Progress Notes (Deleted)
° °  Subjective:    Patient ID: [name] [name], female    DOB: 1965/05/24, 55 y.o.   MRN: [id number]  HPI Over the weekend had a right sided and took Ibuprofen and got better. This has been happening even before recent surgery. Never had before. CT Maxillary with contrast scheduled for next week. Has seen floaters and some photophobis with bright lights. Seeing  Dtr. Nelson at [facility] about this.   Review of Systems     Objective:   Physical Exam        Assessment & Plan:

## 2020-12-24 NOTE — Telephone Encounter (Signed)
[name] [name] called to say she did not go to urgent care and her lymph nodes are still swollen and she would like to come into be seen.

## 2020-12-28 ENCOUNTER — Ambulatory Visit
Admission: RE | Admit: 2020-12-28 | Discharge: 2020-12-28 | Disposition: A | Discharge status: Home / Self Care | Payer: Managed Care, Other (non HMO) | Source: Ambulatory Visit | Attending: Internal Medicine | Admitting: Internal Medicine

## 2020-12-28 MED ORDER — IOPAMIDOL (ISOVUE-300) INJECTION 61%
75.0000 mL | Freq: Once | INTRAVENOUS | Status: AC | PRN
  Administered 2020-12-28: 16:00:00 75 mL via INTRAVENOUS

## 2021-01-04 NOTE — Patient Instructions (Addendum)
CT ordered right maxillofacial area.  Continue Xanax if needed for anxiety.  If symptoms persist may need ENT evaluation.  We will treat with Zithromax Z-PAK.  Have given Diflucan if develops Candida vaginitis.  Start thyroid replacement medication and follow-up in 6 weeks.

## 2021-01-11 ENCOUNTER — Telehealth: Payer: Self-pay | Admitting: Internal Medicine

## 2021-01-11 DIAGNOSIS — G8929 Other chronic pain: Secondary | ICD-10-CM

## 2021-01-11 DIAGNOSIS — R519 Headache, unspecified: Secondary | ICD-10-CM

## 2021-01-11 NOTE — Telephone Encounter (Signed)
Referral placed.

## 2021-01-11 NOTE — Telephone Encounter (Signed)
[name] [name] [phone]  [name] called to say she is still having headaches and pressure on the right side of her temple, so she thinks she should go ahead and get referral for neurology.

## 2021-02-08 NOTE — Progress Notes (Signed)
° °Referring:  °Baxley, Mary J, MD °[address] °[place],  [place] [address] ° °PCP: °Baxley, Mary J, MD ° °Neurology was asked to evaluate [name] [name], a 55 year old female for a chief complaint of headaches.  Our recommendations of care will be communicated by shared medical record.   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 Around that time she was found to have a carcinoid tumor on her right lung, which was removed in October 2022. Symptoms persisted after tumor removal. She reports constant 6/10 pressure behind her right eye, feels like her eyelid is heavy. Has noted right sided eyelid drooping since her surgery. Notes her mother has significant ptosis as well. Has ipsilateral rhinorrhea. No photophobia, phonophobia, or nausea. Takes Advil which helps. 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 She saw ophthalmology in December. Fundus exam was negative for papilledema. ° °Takes gabapentin 200 mg BID for post-op pain. This does not cause side effects. ° °Headache History: °Onset: August 2022 °Triggers: none °Aura: none °Location: right retro-orbital °Quality/Description: pressure °Severity: 6/10 °Associated Symptoms: ° Photophobia: none ° Phonophobia: none ° Nausea: none ° +ipsilateral rhinorrhea and ptosis °Worse with activity?: no °Duration of headaches: constant ° °Headache days per month: 30 °Headache free days per month: 0 ° °Current Treatment: °Abortive °Advil ° °Preventative °none ° °Prior Therapies                                 °Gabapentin 200 mg BID ° °LABS: °CBC °   °Component Value Date/Time  ° WBC 7.1 11/01/2020 1618  ° RBC 3.96 11/01/2020 1618  °  HGB 11.8 (L) 11/01/2020 1618  ° HGB 12.6 02/09/2015 1116  ° HCT 34.8 (L) 11/01/2020 1618  ° HCT 38.3 02/09/2015 1116  ° PLT 316 11/01/2020 1618  ° PLT 139 (L) 02/09/2015 1116  ° MCV 87.9 11/01/2020 1618  ° MCV 89.9 02/09/2015 1116  ° MCH 29.8 11/01/2020 1618  ° MCHC 33.9 11/01/2020 1618  ° RDW 12.2 11/01/2020 1618  ° RDW 12.7 02/09/2015 1116  ° LYMPHSABS 2.6 09/16/2020 0406  ° LYMPHSABS 1.0 02/09/2015 1116  ° MONOABS 0.5 09/16/2020 0406  ° MONOABS 0.4 02/09/2015 1116  ° EOSABS 0.1 09/16/2020 0406  ° EOSABS 0.0 02/09/2015 1116  ° BASOSABS 0.0 09/16/2020 0406  ° BASOSABS 0.0 02/09/2015 1116  ° °CMP Latest Ref Rng & Units 11/01/2020 09/16/2020 09/06/2020  °Glucose 70 - 99 mg/dL 123(H) 88 94  °BUN 6 - 20 mg/dL 9 13 16  °Creatinine 0.44 - 1.00 mg/dL 0.46 0.58 0.67  °Sodium 135 - 145 mmol/L 134(L) 138 135  °Potassium 3.5 - 5.1 mmol/L 3.8 3.9 3.9  °Chloride 98 - 111 mmol/L 100 102 102  °CO2 22 - 32 mmol/L 25 27 25  °Calcium 8.9 - 10.3 mg/dL 9.3 9.6 9.2  °Total Protein 6.5 - 8.1 g/dL - 7.5 -  °Total Bilirubin 0.3 - 1.2 mg/dL - 0.6 -  °Alkaline Phos 38 - 126 U/L - 62 -  °AST 15 - 41 U/L - 15 -  °ALT 0 - 44 U/L - 12 -  ° ° ° °IMAGING:  °MRI brain 10/08/20: °1.   No evidence of acute intracranial abnormality. °2. Partially empty sella, which is often a normal anatomic variant °but can be associated with idiopathic intracranial hypertension. ° °CTA head/neck 09/16/20: °1. 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 ° °Imaging independently reviewed on February 09, 2021  ° °Current Outpatient Medications on File Prior to Visit  °Medication Sig Dispense Refill  ° ALPRAZolam (XANAX) 0.5 MG tablet Take 1 tablet (0.5 mg total) by mouth at bedtime as needed for anxiety. 30 tablet 0  ° gabapentin (NEURONTIN) 100 MG capsule Take 100 mg by mouth 3 (three) times daily.    ° levothyroxine (SYNTHROID) 50 MCG tablet Take 1 tablet (50 mcg total) by mouth  daily. 90 tablet 3  ° Multiple Vitamins-Minerals (MULTIVITAMIN/EXTRA VITAMIN D3 PO) Take 1 tablet by mouth daily.    ° °No current facility-administered medications on file prior to visit.  ° ° ° °Allergies: °Allergies  °Allergen Reactions  ° Elemental Sulfur Hives  ° ° °Family History: °Migraine or other headaches in the family:  no °Aneurysms in a first degree relative:  no °Brain tumors in the family:  no °Other neurological illness in the family:   no ° °Past Medical History: °Past Medical History:  °Diagnosis Date  ° Breast cancer (HCC)   ° stage 0 breast cancer s/p lumpectomy  ° History of radiation therapy 07/27/10-09/09/10  ° right breast 61GYtotal/33fx  ° Personal history of radiation therapy   ° 2012  ° ° °Past Surgical History °Past Surgical History:  °Procedure Laterality Date  ° BREAST LUMPECTOMY Right 06/14/2010  ° right  ° CESAREAN SECTION    ° 1999/2002  ° FINE NEEDLE ASPIRATION  09/14/2020  ° Procedure: FINE NEEDLE ASPIRATION (FNA) LINEAR;  Surgeon: Icard, Bradley L, DO;  Location: [facility];  Service: Pulmonary;;  ° LOBECTOMY Right   ° 10/2020  ° VIDEO BRONCHOSCOPY WITH ENDOBRONCHIAL ULTRASOUND Right 09/14/2020  ° Procedure: VIDEO BRONCHOSCOPY WITH ENDOBRONCHIAL ULTRASOUND;  Surgeon: Icard, Bradley L, DO;  Location: [facility];  Service: Pulmonary;  Laterality: Right;  ° ° °Social History: °Social History  ° °Tobacco Use  ° Smoking status: Never  °  Passive exposure: Never  ° Smokeless tobacco: Never  °Vaping Use  ° Vaping Use: Never used  °Substance Use Topics  ° Alcohol use: Yes  °  Comment: 5-7glasses of wine/ week  ° Drug use: Never  ° ° °ROS: °Negative for fevers, chills. Positive for headaches. All other systems reviewed and negative unless stated otherwise in HPI. ° ° °Physical Exam:  ° °Vital Signs: °BP 116/74    Pulse 64    Ht 5' 4" (1.626 m)    Wt 127 lb (57.6 kg)    SpO2 97%    BMI 21.80 kg/m²  °GENERAL: well appearing,in no acute distress,alert °SKIN:  Color, texture, turgor normal.  No rashes or lesions °HEAD:  Normocephalic/atraumatic. °CV:  RRR °RESP: Normal respiratory effort °MSK: +tenderness to palpation over right temple ° °NEUROLOGICAL: °Mental Status: Alert, oriented to person, place and time,Follows commands °Cranial Nerves: PERRL,visual fields intact to confrontation,extraocular movements intact,facial sensation intact, +right ptosis,hearing grossly intact,no dysarthria °Motor: muscle strength 5/5 both upper and lower extremities,no drift, normal tone °Reflexes: 2+ throughout °Sensation: intact to light touch all 4 extremities °Coordination: Finger-to- nose-finger intact bilaterally °Gait: normal-based ° ° °IMPRESSION: °55 year old female with a history of breast cancer (s/p lumpectomy 2012), lung carcinoid tumor (s/p lobectomy), hypothyroidism who   presents for evaluation of constant right sided headaches for the past 5 months. Her exam is significant for right ptosis and right temporal tenderness. MRI brain showed partially empty sella, and ophthalmology exam was normal. CTA head/neck was suggestive of right ICA fibromuscular dysplasia but no evidence of dissection. Will check ESR/CRP today given temporal tenderness on exam. Will plan for indomethacin trial to rule out hemicrania continue given ipsilateral ptosis and rhinorrhea. If no improvement would consider treating as new daily persistent headache. ° °PLAN: °-ESR/CRP °-Indomethacin trial: 25 mg TID x7 days, then 50 mg TID x7 days, then 75 mg TID x7 days °-next steps: consider increasing gabapentin, consider repeat vessel imaging if symptoms do not improve despite treatment ° ° °I spent a total of 46 minutes chart reviewing and counseling the patient. Headache education was done. Discussed treatment options including preventive and acute medications, natural supplements, and physical therapy. Discussed medication overuse headache and to limit use of acute treatments to no more than 2 days/week or 10 days/month. Discussed  medication side effects, adverse reactions and drug interactions. Written educational materials and patient instructions outlining all of the above were given. ° °Follow-up: 3 months ° ° °Jennifer Chima, MD °02/09/2021   °10:10 AM ° ° °

## 2021-02-09 ENCOUNTER — Encounter: Payer: Self-pay | Admitting: Psychiatry

## 2021-02-09 ENCOUNTER — Other Ambulatory Visit: Payer: Self-pay | Admitting: Obstetrics and Gynecology

## 2021-02-09 ENCOUNTER — Ambulatory Visit: Payer: Managed Care, Other (non HMO) | Admitting: Psychiatry

## 2021-02-09 VITALS — BP 116/74 | HR 64 | Ht 64.0 in | Wt 127.0 lb

## 2021-02-09 DIAGNOSIS — R519 Headache, unspecified: Secondary | ICD-10-CM | POA: Diagnosis not present

## 2021-02-09 DIAGNOSIS — Z1231 Encounter for screening mammogram for malignant neoplasm of breast: Secondary | ICD-10-CM

## 2021-02-09 MED ORDER — INDOMETHACIN 25 MG PO CAPS: ORAL_CAPSULE | ORAL | 0 refills | Status: DC

## 2021-02-09 NOTE — Patient Instructions (Addendum)
Indomethacin trial to rule out hemicrania continua:  Week  1:  -Please obtain generic omeprazole (20 mg) [[employer], [employer], [employer], etc] and start one daily. You may increase to twice a day if necessary. -Please start indomethacin (indocin) 25 mg (one capsule) 3 times per day with meals. -If, in one week, you are headache-free, this is your dose, stay on it. -If, in one week, you are tolerating the indocin, but have headache, then increase the dose as below. Week 2: -Please increase indocin to 50 mg (2 capsules) 3 times per day with meals. -If, in one week, you are headache-free, this is your dose, stay on it. -If, in one week, you are tolerating the indocin, but have headache, then increase the dose as below. Weeks 3 and 4:  -Please increase indocin to 75 mg (3 capsules) 3 times per day with meals. -If, in one week, you are headache-free, this is your dose, stay on it. -If, in one week, you have partial relief, call or see her neurologist. -If, in one week, you have no relief, stop the indocin.  If no improvement with indomethacin let me know via MyChart or you can call the office

## 2021-02-10 ENCOUNTER — Encounter: Payer: Self-pay | Admitting: *Deleted

## 2021-02-10 ENCOUNTER — Telehealth: Payer: Self-pay | Admitting: Psychiatry

## 2021-02-10 LAB — C-REACTIVE PROTEIN: CRP: <1 mg/L (ref 0–10)

## 2021-02-10 LAB — SEDIMENTATION RATE: Sed Rate: 3 mm/hr (ref 0–40)

## 2021-02-10 NOTE — Telephone Encounter (Signed)
Pt called needing to discuss her indomethacin (INDOCIN) 25 MG capsule with the RN. She things she it is the cause of her getting dizzy. Please advise.

## 2021-02-11 ENCOUNTER — Encounter: Payer: Self-pay | Admitting: Psychiatry

## 2021-02-11 NOTE — Telephone Encounter (Signed)
Yes, she can try taking it twice a day for a couple of days and see if she tolerates that better. Thanks

## 2021-02-21 ENCOUNTER — Encounter: Payer: Self-pay | Admitting: Pulmonary Disease

## 2021-02-21 NOTE — Telephone Encounter (Signed)
Dr. Valeta Harms, please see pt's email regarding a lung nodule. Thanks.

## 2021-02-28 ENCOUNTER — Other Ambulatory Visit: Payer: Self-pay | Admitting: Psychiatry

## 2021-03-09 ENCOUNTER — Ambulatory Visit
Admission: RE | Admit: 2021-03-09 | Discharge: 2021-03-09 | Disposition: A | Discharge status: Home / Self Care | Payer: Managed Care, Other (non HMO) | Source: Ambulatory Visit | Attending: Obstetrics and Gynecology | Admitting: Obstetrics and Gynecology

## 2021-03-09 DIAGNOSIS — Z1231 Encounter for screening mammogram for malignant neoplasm of breast: Secondary | ICD-10-CM

## 2021-03-10 ENCOUNTER — Other Ambulatory Visit: Payer: Self-pay | Admitting: Obstetrics and Gynecology

## 2021-03-10 DIAGNOSIS — R928 Other abnormal and inconclusive findings on diagnostic imaging of breast: Secondary | ICD-10-CM

## 2021-04-01 ENCOUNTER — Ambulatory Visit
Admission: RE | Admit: 2021-04-01 | Discharge: 2021-04-01 | Disposition: A | Discharge status: Home / Self Care | Payer: Managed Care, Other (non HMO) | Source: Ambulatory Visit | Attending: Obstetrics and Gynecology | Admitting: Obstetrics and Gynecology

## 2021-04-01 ENCOUNTER — Ambulatory Visit: Payer: Managed Care, Other (non HMO)

## 2021-04-01 DIAGNOSIS — R928 Other abnormal and inconclusive findings on diagnostic imaging of breast: Secondary | ICD-10-CM

## 2021-05-26 ENCOUNTER — Encounter: Payer: Self-pay | Admitting: Psychiatry

## 2021-05-26 ENCOUNTER — Ambulatory Visit: Payer: Managed Care, Other (non HMO) | Admitting: Psychiatry

## 2021-05-26 VITALS — BP 119/76 | HR 64 | Ht 64.0 in | Wt 120.0 lb

## 2021-05-26 DIAGNOSIS — G4452 New daily persistent headache (NDPH): Secondary | ICD-10-CM

## 2021-05-26 NOTE — Patient Instructions (Signed)
GENERAL HEADACHE INSTRUCTIONS   Natural supplements: Magnesium Oxide or Magnesium Glycinate 500 mg at bed (up to 800 mg daily) Coenzyme Q10 300 mg in AM Vitamin B2- 200 mg twice a day  Add 1 supplement at a time since even natural supplements can have undesirable side effects. You can sometimes buy supplements cheaper (especially Coenzyme Q10) at www.https://compton-perez.com/ or at [employer].  Vitamins and herbs that show potential:  Magnesium: Magnesium (250 mg twice a day or 500 mg at bed) has a relaxant effect on smooth muscles such as blood vessels. Individuals suffering from frequent or daily headache usually have low magnesium levels which can be increase with daily supplementation of 400-750 mg. Three trials found 40-90% average headache reduction  when used as a preventative. Magnesium also demonstrated the benefit in menstrually related migraine.  Magnesium is part of the messenger system in the serotonin cascade and it is a good muscle relaxant.  It is also useful for constipation which can be a side effect of other medications used to treat migraine. Good sources include nuts, whole grains, and tomatoes. Side Effects: loose stool/diarrhea Riboflavin (vitamin B 2) 200 mg twice a day. This vitamin assists nerve cells in the production of ATP a principal energy storing molecule.  It is necessary for many chemical reactions in the body.  There have been at least 3 clinical trials of riboflavin using 400 mg per day all of which suggested that migraine frequency can be decreased.  All 3 trials showed significant improvement in over half of migraine sufferers.  The supplement is found in bread, cereal, milk, meat, and poultry.  Most Americans get more riboflavin than the recommended daily allowance, however riboflavin deficiency is not necessary for the supplements to help prevent headache. Side effects: energizing, green urine  Coenzyme Q10: This is present in almost all cells in the body and is critical  component for the conversion of energy.  Recent studies have shown that a nutritional supplement of CoQ10 can reduce the frequency of migraine attacks by improving the energy production of cells as with riboflavin.  Doses of 150 mg twice a day have been shown to be effective.  Melatonin: Increasing evidence shows correlation between melatonin secretion and headache conditions.  Melatonin supplementation has decreased headache intensity and duration.  It is widely used as a sleep aid.  Sleep is natures way of dealing with migraine.  A dose of 3 mg is recommended to start for headaches including cluster headache. Higher doses up to 15 mg has been reviewed for use in Cluster headache and have been used. The rationale behind using melatonin for cluster is that many theories regarding the cause of Cluster headache center around the disruption of the normal circadian rhythm in the brain.  This helps restore the normal circadian rhythm.  HEADACHE DIET: Foods and beverages which may trigger migraine Note that only 20% of headache patients are food sensitive. You will know if you are food sensitive if you get a headache consistently 20 minutes to 2 hours after eating a certain food. Only cut out a food if it causes headaches, otherwise you might remove foods you enjoy! What matters most for diet is to eat a well balanced healthy diet full of vegetables and low fat protein, and to not miss meals.  Chocolate, other sweets ALL cheeses except cottage and cream cheese Dairy products, yogurt, sour cream, ice cream Liver Meat extracts (Bovril, Marmite, meat tenderizers) Meats or fish which have undergone aging, fermenting, pickling or  smoking. These include: Hotdogs,salami,Lox,sausage, mortadellas,smoked salmon, pepperoni, Pickled herring Pods of broad bean (English beans, Chinese pea pods, [place] (fava) beans, lima and navy beans Ripe avocado, ripe banana Yeast extracts or active yeast preparations such as  Brewer's or Fleishman's (commercial bakes goods are permitted) Tomato based foods, pizza (lasagna, etc.)  MSG (monosodium glutamate) is disguised as many things; look for these common aliases: Monopotassium glutamate Autolysed yeast Hydrolysed protein Sodium caseinate "flavorings" "all natural preservatives" Nutrasweet  Avoid all other foods that convincingly provoke headaches.  Resources: The Dizzy Lu Duffel Your Headache Diet, migrainestrong.com  https://www.aguirre.org/  Caffeine and Migraine For patients that have migraine, caffeine intake more than 3 days per week can lead to dependency and increased migraine frequency. I would recommend cutting back on your caffeine intake as best you can. The recommended amount of caffeine is 200-300 mg daily, although migraine patients may experience dependency at even lower doses. While you may notice an increase in headache temporarily, cutting back will be helpful for headaches in the long run. For more information on caffeine and migraine, visit: https://americanmigrainefoundation.org/resource-library/caffeine-and-migraine/  Headache Prevention Strategies:  1. Maintain a headache diary; learn to identify and avoid triggers.  - This can be a simple note where you log when you had a headache, associated symptoms, and medications used - There are several smartphone apps developed to help track migraines: Migraine Buddy, Migraine Monitor, Curelator N1-Headache App  Common triggers include: Emotional triggers: Emotional/Upset family or friends Emotional/Upset occupation Business reversal/success Anticipation anxiety Crisis-serious Post-crisis periodNew job/position   Physical triggers: Vacation Day Weekend Strenuous Exercise High Altitude Location New Move Menstrual Day Physical Illness Oversleep/Not enough sleep Weather changes Light: Photophobia or light sesnitivity treatment  involves a balance between desensitization and reduction in overly strong input. Use dark polarized glasses outside, but not inside. Avoid bright or fluorescent light, but do not dim environment to the point that going into a normally lit room hurts. Consider FL-41 tint lenses, which reduce the most irritating wavelengths without blocking too much light.  These can be obtained at axonoptics.com or theraspecs.com Foods: see list above.  2. Limit use of acute treatments (over-the-counter medications, triptans, etc.) to no more than 2 days per week or 10 days per month to prevent medication overuse headache (rebound headache).    3. Follow a regular schedule (including weekends and holidays): Don't skip meals. Eat a balanced diet. 8 hours of sleep nightly. Minimize stress. Exercise 30 minutes per day. Being overweight is associated with a 5 times increased risk of chronic migraine. Keep well hydrated and drink 6-8 glasses of water per day.  4. Initiate non-pharmacologic measures at the earliest onset of your headache. Rest and quiet environment. Relax and reduce stress. Breathe2Relax is a free app that can instruct you on    some simple relaxtion and breathing techniques. Http://Dawnbuse.com is a    free website that provides teaching videos on relaxation.  Also, there are  many apps that   can be downloaded for "mindful" relaxation.  An app called YOGA NIDRA will help walk you through mindfulness. Another app called Calm can be downloaded to give you a structured mindfulness guide with daily reminders and skill development. Headspace for guided meditation Mindfulness Based Stress Reduction Online Course: www.palousemindfulness.com Cold compresses.  5. Don't wait!! Take the maximum allowable dosage of prescribed medication at the first sign of migraine.  6. Compliance:  Take prescribed medication regularly as directed and at the first sign of a migraine.  7. Communicate:  Call  your physician when  problems arise, especially if your headaches change, increase in frequency/severity, or become associated with neurological symptoms (weakness, numbness, slurred speech, etc.).  8. Headache/pain management therapies: Consider various complementary methods, including medication, behavioral therapy, psychological counselling, biofeedback, massage therapy, acupuncture, dry needling, and other modalities.  Such measures may reduce the need for medications. Counseling for pain management, where patients learn to function and ignore/minimize their pain, seems to work very well.  9. Recommend changing family's attention and focus away from patient's headaches. Instead, emphasize daily activities. If first question of day is 'How are your headaches/Do you have a headache today?', then patient will constantly think about headaches, thus making them worse. Goal is to re-direct attention away from headaches, toward daily activities and other distractions.  10. Helpful Websites: www.AmericanHeadacheSociety.org VoipObserver.it www.headaches.org GolfingFamily.no www.achenet.org

## 2021-05-26 NOTE — Progress Notes (Signed)
   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 MRI 09/2020 with partially empty sella, but no papilledema on fundus exam. ESR/CRP were normal.  At her last visit she was started on an indomethacin trial.  Interval History: She has had some improvement in her headaches since her last visit. Headaches are no longer constant. She was unable to tolerate indomethacin so she stopped it. Stopped her gabapentin as well. She currently has right sided pressure headaches about twice per week. They are not associated with photophobia, phonophobia, or nausea. She tried Advil as needed one time which seemed to help.   Headache days per month: 8 Headache free days per month: 22  Current Headache Regimen: Preventative: none Abortive: Advil  Prior Therapies                                  Gabapentin 200 mg BID Indomethacin - side effects  Physical Exam:   Vital Signs: BP 119/76   Pulse 64   Ht $R'5\' 4"'Fi$  (1.626 m)   Wt 120 lb (54.4 kg)   BMI 20.60 kg/m  GENERAL:  well appearing, in no acute distress, alert  SKIN:  Color, texture, turgor normal. No rashes or lesions HEAD:  Normocephalic/atraumatic. RESP: normal respiratory effort MSK:  No gross joint deformities. No tenderness to palpation over neck or occiput.  NEUROLOGICAL: Mental Status: Alert, oriented to person, place and time, Follows commands, and Speech fluent and appropriate. Cranial Nerves: PERRL, face symmetric, no dysarthria, hearing grossly intact Motor: moves all extremities equally Gait: normal-based.  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 She was unable to tolerate indomethacin trial. Headaches are most consistent with new daily persistent headache, tension type. She would prefer to avoid starting a new medication as she has had some improvement over the past few months. Lifestyle measures including supplements, diet, and stress management discussed. She will try these first and let me know if she decides she would like to try a medication in the future. Offered referral to psychology to help with her anxiety. She will think about it and reach out if she chooses to pursue this.  PLAN: -Supplement information provided (magnesium, B2, CoQ10) -Consider Psychology referral for anxiety, patient will let me know if she decides to pursue this -next steps: consider restarting gabapentin vs Cymbalta   Follow-up: 4 months  I spent a total of 32 minutes on the date of the service. Headache education was done. Written educational materials and patient instructions outlining all of the above were given.  Genia Harold, MD 05/26/21 11:30 AM

## 2021-07-05 ENCOUNTER — Encounter: Payer: Self-pay | Admitting: Internal Medicine

## 2021-08-10 ENCOUNTER — Telehealth: Payer: Self-pay | Admitting: Psychiatry

## 2021-08-10 NOTE — Telephone Encounter (Signed)
Spoke with patient who stated in Past couple weeks  she's not had pain but pressure on right side of face and ear, no sinus issues noted. She stated it is getting worse. We rescheduled her FU for tomorrow, canceled the FU in [place]. Patient verbalized understanding, appreciation.

## 2021-08-10 NOTE — Telephone Encounter (Signed)
Pt is calling requesting a sooner appointment because she said she is having bad headaches on the right-side of her face and the temple area.

## 2021-08-11 ENCOUNTER — Encounter: Payer: Self-pay | Admitting: Psychiatry

## 2021-08-11 ENCOUNTER — Ambulatory Visit: Payer: Managed Care, Other (non HMO) | Admitting: Psychiatry

## 2021-08-11 VITALS — BP 118/77 | HR 59 | Ht 64.0 in | Wt 120.2 lb

## 2021-08-11 DIAGNOSIS — G44229 Chronic tension-type headache, not intractable: Secondary | ICD-10-CM | POA: Diagnosis not present

## 2021-08-11 DIAGNOSIS — M542 Cervicalgia: Secondary | ICD-10-CM | POA: Diagnosis not present

## 2021-08-11 NOTE — Patient Instructions (Signed)
Physical therapy for neck tension and headaches

## 2021-08-11 NOTE — Progress Notes (Signed)
   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 MRI 09/2020 with partially empty sella, but no papilledema on fundus exam. ESR/CRP were normal.  She had been doing well at her last visit and had stopped her medications.  Interval History: In the past couple of weeks she has developed right sided pressure in her face and nose. Feels a sensation of tightness around her throat as well. Pressure is intermittent and can last for a few hours at a time. Right side of her face will feel tight as well. Seems like pressure is getting more intense over time. Possibly some photophobia, no phonophobia or nausea. Did have two episodes where she saw lights in her vision which lasted for ~3 minutes. She could still see lights when she closed her eyes.   Current Headache Regimen: Preventative: none Abortive: none  Prior Therapies                                  Gabapentin 200 mg BID Indomethacin - side effects  Physical Exam:   Vital Signs: BP 118/77   Pulse (!) 59   Ht $R'5\' 4"'oT$  (1.626 m)   Wt 120 lb 3.2 oz (54.5 kg)   BMI 20.63 kg/m  GENERAL:  well appearing, in no acute distress, alert  SKIN:  Color, texture, turgor normal. No rashes or lesions HEAD:  Normocephalic/atraumatic. RESP: normal respiratory effort MSK:  No gross joint deformities. Tenderness to palpation over bilateral shoulders and mildly decreased cervical range of motion turning head left > right  NEUROLOGICAL: Mental Status: Alert, oriented to person, place and time, Follows commands, and Speech fluent and appropriate. Cranial Nerves: PERRL, face symmetric, no  dysarthria, hearing grossly intact Motor: moves all extremities equally Gait: normal-based.  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 She had been doing well until a few weeks ago when her headaches returned. Suspect this may be partially due to muscle spasms as she reports sensations of tightness around her face and throat. She does have mildly decreased cervical range of motion on exam. She would prefer to avoid medications if possible. Will refer to neck PT to help with her neck tension and headaches.  PLAN: -Referral to neck PT -next steps: consider PRN muscle relaxer, consider restarting gabapentin   Follow-up: 7 months  I spent a total of 29 minutes on the date of the service. Headache education was done.  Discussed treatment options including preventive and acute medications, and physical therapy. Discussed medication overuse headache and to limit use of acute treatments to no more than 2 days/week or 10 days/month. Discussed medication side effects, adverse reactions and drug interactions. Written educational materials and patient instructions outlining all of the above were given.  Genia Harold, MD 08/11/21 4:02 PM

## 2021-08-12 ENCOUNTER — Other Ambulatory Visit: Payer: Self-pay | Admitting: Internal Medicine

## 2021-08-12 DIAGNOSIS — R0989 Other specified symptoms and signs involving the circulatory and respiratory systems: Secondary | ICD-10-CM

## 2021-08-12 DIAGNOSIS — Z13820 Encounter for screening for osteoporosis: Secondary | ICD-10-CM

## 2021-08-16 ENCOUNTER — Ambulatory Visit
Admission: RE | Admit: 2021-08-16 | Discharge: 2021-08-16 | Disposition: A | Discharge status: Home / Self Care | Payer: Managed Care, Other (non HMO) | Source: Ambulatory Visit | Attending: Internal Medicine | Admitting: Internal Medicine

## 2021-08-16 DIAGNOSIS — R0989 Other specified symptoms and signs involving the circulatory and respiratory systems: Secondary | ICD-10-CM

## 2021-08-25 ENCOUNTER — Encounter: Payer: Self-pay | Admitting: Physical Therapy

## 2021-08-25 ENCOUNTER — Ambulatory Visit: Payer: Managed Care, Other (non HMO) | Attending: Psychiatry | Admitting: Physical Therapy

## 2021-08-25 DIAGNOSIS — M542 Cervicalgia: Secondary | ICD-10-CM | POA: Insufficient documentation

## 2021-08-25 NOTE — Therapy (Signed)
OUTPATIENT PHYSICAL THERAPY CERVICAL EVALUATION   Patient Name: [name] [name] MRN: [id number] DOB:[birth year], 56 y.o., female Today's Date: 08/25/2021   PT End of Session - 08/25/21 1120     Visit Number 1    Number of Visits 9    Date for PT Re-Evaluation 09/24/21    Authorization Type [employer]ign[employer]    PT Start Time 0930    PT Stop Time 2683    PT Time Calculation (min) 45 min    Activity Tolerance Patient tolerated treatment well    Behavior During Therapy [facility] for tasks assessed/performed             Past Medical History:  Diagnosis Date   Breast cancer ([place])    stage 0 breast cancer s/p lumpectomy   Headache    History of radiation therapy 07/27/10-09/09/10   right breast [id number]/[id number]   Personal history of radiation therapy    2012   Past Surgical History:  Procedure Laterality Date   BREAST LUMPECTOMY Right 06/14/2010   right   CESAREAN SECTION     1999/2002   FINE NEEDLE ASPIRATION  09/14/2020   Procedure: FINE NEEDLE ASPIRATION (FNA) LINEAR;  Surgeon: Garner Nash, DO;  Location: [place] [facility];  Service: Pulmonary;;   LOBECTOMY Right    10/2020   VIDEO BRONCHOSCOPY WITH ENDOBRONCHIAL ULTRASOUND Right 09/14/2020   Procedure: VIDEO BRONCHOSCOPY WITH ENDOBRONCHIAL ULTRASOUND;  Surgeon: Garner Nash, DO;  Location: [place];  Service: Pulmonary;  Laterality: Right;   Patient Active Problem List   Diagnosis Date Noted   History of benign carcinoid tumor 12/19/2020   Hilar mass 09/09/2020   Neck pain on left side 03/01/2015   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 Reports increased tension and tightness on R side. Headaches come and go - last one that she had was on Sunday. Reports that she sometimes has light sensitivity. Has some pressure sometimes between her R eye. Normally walks  everyday - 5x a week. Can tell that she is not as strong on her R side.   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 MRI 09/2020 with partially empty sella, but no papilledema on fundus exam. ESR/CRP were normal.   PAIN:  Are you having pain? No - no headache or pain   PRECAUTIONS: None  OCCUPATION: Tree surgeon  PLOF: Independent  PATIENT GOALS Wants to get pressure and headaches to go away.   OBJECTIVE:     PATIENT SURVEYS:  NDI 2/50 = 4%  Pt only scoring on the headache section.   COGNITION: Overall cognitive status: Within functional limits for tasks assessed   SENSATION: WFL  Pt reports some tingling down LLE that feels like sciatic pain.   POSTURE: No Significant postural limitations  PALPATION: Incr TTP with trigger points to R rhomboids, lower/mid traps, upper trap,  levator scapulae, R suboccipitals.  Hypomobility to central and R C7-T5, central and R C2/C3.     CERVICAL ROM:   Active ROM A/PROM (deg) eval  Flexion 60  Extension 70   Right lateral flexion 45  Left lateral flexion 45 ( feels more pulling on R side)  Right rotation 68  Left rotation 60 (feels pulling on R side)   (Blank rows = not  tested)  UPPER EXTREMITY ROM: WFL   UPPER EXTREMITY MMT:  MMT Right eval Left eval  Shoulder flexion    Shoulder extension    Shoulder abduction    Shoulder adduction    Shoulder extension    Shoulder internal rotation    Shoulder external rotation    Middle trapezius 3+/5 4+/5  Lower trapezius 3+/5 4/5  Elbow flexion    Elbow extension    Wrist flexion    Wrist extension    Wrist ulnar deviation    Wrist radial deviation    Wrist pronation    Wrist supination    Grip strength     (Blank rows = not tested)  Bilateral UE MMTs in sitting 5/5     TODAY'S TREATMENT:  Access Code: [email or web address] URL: [email or web address] Date: 08/25/2021 Prepared by: Janann August  Initiated HEP for stretching/thoracic mobility. See MedBridge for more details.   Exercises - Seated Levator Scapulae Stretch  - 2 x daily - 7 x weekly - 3 sets - 30 hold - Cat Cow  - 2 x daily - 7 x weekly - 1 sets - 10 reps - Quadruped Full Range Thoracic Rotation with Reach  - 2 x daily - 7 x weekly - 1 sets - 10 reps   PATIENT EDUCATION:  Education details: Clinical findings, POC, Educated on purpose and what dry needling is (pt would be interested in this), discussed that pt would be better served at an orthopedic clinic based on impairments and front desk to help pt get scheduled at [facility]. Initial HEP for cervical stretching, thoracic spine mobility.  Person educated: Patient Education method: Explanation, Demonstration, Verbal cues, and Handouts Education comprehension: verbalized understanding and returned demonstration   HOME EXERCISE PROGRAM: [email or web address]  ASSESSMENT:  CLINICAL IMPRESSION: Patient is a 56 year old female referred to Neuro [facility] for Cervicalgia.   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 The following deficits were present during the exam: stiffness  with cervical AROM, decr strength of R>L lower/mid traps, hypomobility of cervical/thoracic spine, TTP/trigger points to R upper traps, parascapular muscles, levator scap. Pt reports headaches/pressure on R side, but none during eval today. Pt would benefit from skilled PT to address these impairments and functional limitations to decr headaches and improve mobility.     OBJECTIVE IMPAIRMENTS decreased mobility, decreased ROM, decreased strength, hypomobility, increased fascial restrictions, and increased muscle spasms.   ACTIVITY LIMITATIONS  N/A  PARTICIPATION LIMITATIONS:  N/A   PERSONAL FACTORS Past/current experiences are also affecting patient's functional outcome.   REHAB POTENTIAL: Excellent  CLINICAL DECISION MAKING: Stable/uncomplicated  EVALUATION COMPLEXITY: Low   GOALS: Goals reviewed with patient? Yes  SHORT TERM GOALS: ALL STGS= LTGS  LONG TERM GOALS: Target date: 09/22/2021  Pt will be independent with final HEP in order to build upon functional gains made in therapy. Baseline:  Goal status: INITIAL  2.  Pt will subjectively report at least a 75% improvement in headaches.  Baseline: Goal status: INITIAL  3.  Pt will improve L cervical rotation AROM to at least 70 degrees with no reports of pulling/tightness in order to demo improved rotation for daily living.  Baseline: 60 degrees, reports of pulling on R side.  Goal status: INITIAL   PLAN: PT FREQUENCY: 2x/week  PT DURATION: 4 weeks  PLANNED INTERVENTIONS: Therapeutic exercises, Therapeutic activity, Neuromuscular re-education, Patient/Family education, Self Care, Joint mobilization, Dry Needling, Spinal mobilization, Cryotherapy, Moist heat, Manual therapy, and Re-evaluation  PLAN FOR NEXT SESSION: Manual therapy/joint mobilizations to thoracic spine/cervicothoracic junction and mobility exercises. Trial dry needling to R rhomboids, upper trap, levator scap (whichever is most needed). Mid/lower trap  strengthening.    Arliss Journey, PT, DPT  08/25/2021, 11:27 AM

## 2021-09-16 ENCOUNTER — Ambulatory Visit (INDEPENDENT_AMBULATORY_CARE_PROVIDER_SITE_OTHER): Payer: Managed Care, Other (non HMO) | Admitting: Rehabilitative and Restorative Service Providers"

## 2021-09-16 ENCOUNTER — Encounter: Payer: Self-pay | Admitting: Rehabilitative and Restorative Service Providers"

## 2021-09-16 DIAGNOSIS — M542 Cervicalgia: Secondary | ICD-10-CM | POA: Diagnosis not present

## 2021-09-16 NOTE — Therapy (Signed)
OUTPATIENT PHYSICAL THERAPY TREATMENT NOTE   Patient Name: [name] [name] MRN: [id number] DOB:1965/02/03, 56 y.o., female Today's Date: 09/16/2021  END OF SESSION:   PT End of Session - 09/16/21 1316     Visit Number 2    Number of Visits 9    Date for PT Re-Evaluation 09/24/21    Authorization Type [employer]    PT Start Time 1313    PT Stop Time 1345    PT Time Calculation (min) 32 min    Activity Tolerance Patient tolerated treatment well    Behavior During Therapy [facility] for tasks assessed/performed             Past Medical History:  Diagnosis Date   Breast cancer ([place])    stage 0 breast cancer s/p lumpectomy   Headache    History of radiation therapy 07/27/10-09/09/10   right breast 61GYtotal/42f   Personal history of radiation therapy    2012   Past Surgical History:  Procedure Laterality Date   BREAST LUMPECTOMY Right 06/14/2010   right   CESAREAN SECTION     1999/2002   FINE NEEDLE ASPIRATION  09/14/2020   Procedure: FINE NEEDLE ASPIRATION (FNA) LINEAR;  Surgeon: IGarner Nash DO;  [facility];  Service: Pulmonary;;   LOBECTOMY Right    10/2020   VIDEO BRONCHOSCOPY WITH ENDOBRONCHIAL ULTRASOUND Right 09/14/2020   Procedure: VIDEO BRONCHOSCOPY WITH ENDOBRONCHIAL ULTRASOUND;  Surgeon: IGarner Nash DO;  [place]  Service: Pulmonary;  Laterality: Right;   Patient Active Problem List   Diagnosis Date Noted   History of benign carcinoid tumor 12/19/2020   Hilar mass 09/09/2020   Neck pain on left side 03/01/2015   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   [place]     THERAPY DIAG:  Cervicalgia   PCP: WMichael Boston MD  REFERRING PROVIDER: CGenia Harold MD  REFERRING DIAG: M54.2 (ICD-10-CM) - Cervicalgia   Rationale for Evaluation and Treatment Rehabilitation  ONSET DATE: 08/11/2021   SUBJECTIVE:                                                                                                                                                                                                          SUBJECTIVE STATEMENT: Pt indicated not feeling strength in Rt side and having headaches since treatment process realted to cancer treatments.  [facility]  Pt indicated complaints of knots in shoulders/neck.   Pt  indicated complaints of pressure of Rt side of head/face and eye and bilateral neck, posterior shoulders.   Denied numbness/tingling.  Has reported some tingling in lower leg bilaterally, sometimes noted at night.  No bowel/bladder changes.   Has some tightness in buttock. 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 MRI 09/2020 with partially empty sella, but no papilledema on fundus exam. ESR/CRP were normal.   PAIN:  Pain number:  current 0/10, at worst 5/10 Location:  Rt side of head/face, bilateral neck Description:  pressure, tightness, "hurting" Aggravating fx: maybe stress Easing fx:  PRECAUTIONS: History of cancer  OCCUPATION: Tree surgeon  PLOF: Independent  PATIENT GOALS Wants to get pressure and headaches to go away.   OBJECTIVE:   PATIENT SURVEYS:  08/25/2021 NDI 2/50 = 4%  Pt only scoring on the headache section.  COGNITION: 08/25/2021 Overall cognitive status: Within functional limits for tasks assessed  SENSATION: 08/25/2021 [facility] Pt reports some tingling down LLE that feels like sciatic pain.   POSTURE:  08/25/2021 No Significant postural limitations  PALPATION: 09/16/2021  Rt upper trap, Rt levator scap, rt  and Lt infraspinatus trigger points throughout with increased tenderness  08/25/2021 Incr TTP with trigger points to R rhomboids, lower/mid traps, upper trap,  levator scapulae, R suboccipitals.  Hypomobility to central and R C7-T5, central and R C2/C3.     CERVICAL ROM:   Active ROM AROM (deg) 08/25/2021 AROM 09/16/2021  Flexion 60 42  Extension 70  69  Right lateral flexion 45   Left lateral flexion 45 ( feels more pulling on R side)   Right rotation 68 78 (feels pulling on Rt side)  Left rotation 60 (feels pulling on R side) 58   (Blank rows = not tested)  UPPER EXTREMITY ROM: 08/25/2021 [facility]   UPPER EXTREMITY MMT:  MMT Right 08/25/2021 Left 08/25/2021 Right 09/16/2021 Left 09/16/2021  Shoulder flexion   4/5 5/5  Shoulder extension      Shoulder abduction   5/5 5/5  Shoulder adduction      Shoulder extension      Shoulder internal rotation   5/5 5/5  Shoulder external rotation   5/5 5/5  Middle trapezius 3+/5 4+/5    Lower trapezius 3+/5 4/5    Elbow flexion      Elbow extension      Wrist flexion      Wrist extension      Wrist ulnar deviation      Wrist radial deviation      Wrist pronation      Wrist supination      Grip strength       (Blank rows = not tested)  08/25/2021 Bilateral UE MMTs in sitting 5/5     TODAY'S TREATMENT:  09/16/21 Manual:  Compression to Rt upper trap increased pressure around occipital/temporal region of Rt side of head  Dry needling: twitch response c concordant symptoms Rt upper trap.  No adverse reaction.  Verbal consent given for today's treatment.   TherEx:  Seated scap retraction and shoulder ER x 10  Seated cervical retraction into therapist hand x 5  Review of existing HEP c cues for techniques.  Dry needling handout and cues given for after care.    Moist heat Rt upper trap during there ex education/performance  PATIENT EDUCATION:  Education details: HEP progression, Dry needling Person educated: Patient Education  method: Explanation, Demonstration, Verbal cues, and Handouts Education comprehension: verbalized understanding and returned demonstration   HOME EXERCISE PROGRAM: Access Code: [email or web address] URL: [email or web address] Date: 09/16/2021 Prepared by: Scot Jun  Exercises - Seated Levator Scapulae Stretch  - 2 x daily - 7 x weekly - 3 sets - 30s hold - Seated Upper Trapezius Stretch  - 2 x daily - 7 x weekly - 3 sets - 30s hold - Quadruped Full Range Thoracic Rotation with Reach  - 2 x daily - 7 x weekly - 1 sets - 10 reps - Shoulder External Rotation and Scapular Retraction with Resistance  - 3-5 x daily - 7 x weekly - 1-2 sets - 10 reps - Supine Cervical Retraction with Towel  - 2 x daily - 7 x weekly - 1 sets - 10 reps - 5 hold  ASSESSMENT:  CLINICAL IMPRESSION: Presentation of trigger points in multiple muscles as well as some mobility deficits related to presentation.  Successful treatment from today's intervention c no adverse reactions.  Education on after visit care for soreness given.  Plan to continue in future as desired.    OBJECTIVE IMPAIRMENTS decreased mobility, decreased ROM, decreased strength, hypomobility, increased fascial restrictions, and increased muscle spasms.   ACTIVITY LIMITATIONS N/A  PARTICIPATION LIMITATIONS: N/A   PERSONAL FACTORS Past/current experiences are also affecting patient's functional outcome.   REHAB POTENTIAL: Excellent  CLINICAL DECISION MAKING: Stable/uncomplicated  EVALUATION COMPLEXITY: Low   GOALS: Goals reviewed with patient? Yes  SHORT TERM GOALS: ALL STGS= LTGS  LONG TERM GOALS: Target date: 09/22/2021  Pt will be independent with final HEP in order to build upon functional gains made in therapy. Baseline:  Goal status: on going - assessed 09/16/2021  2.  Pt will subjectively report at least a 75% improvement in headaches.  Baseline: Goal status: on going - assessed 09/16/2021  3.  Pt will improve Lt cervical  rotation AROM to at least 70 degrees with no reports of pulling/tightness in order to demo improved rotation for daily living.  Baseline: 60 degrees, reports of pulling on Rt side.  Goal status: on going - assessed 09/16/2021   PLAN: PT FREQUENCY: 2x/week  PT DURATION: 4 weeks  PLANNED INTERVENTIONS: Therapeutic exercises, Therapeutic activity, Neuromuscular re-education, Patient/Family education, Self Care, Joint mobilization, Dry Needling, Spinal mobilization, Cryotherapy, Moist heat, Manual therapy, and Re-evaluation  PLAN FOR NEXT SESSION: Dry needling as desired, thoracic mobility improvement.s    Scot Jun, PT, DPT, OCS, ATC 09/16/21  2:08 PM

## 2021-09-20 ENCOUNTER — Ambulatory Visit: Payer: Managed Care, Other (non HMO) | Admitting: Rehabilitative and Restorative Service Providers"

## 2021-09-20 ENCOUNTER — Encounter: Payer: Self-pay | Admitting: Rehabilitative and Restorative Service Providers"

## 2021-09-20 DIAGNOSIS — M542 Cervicalgia: Secondary | ICD-10-CM

## 2021-09-20 NOTE — Therapy (Signed)
OUTPATIENT PHYSICAL THERAPY TREATMENT NOTE   Patient Name: [name] [name] MRN: [id number] DOB:[birth year], 56 y.o., female Today's Date: 09/20/2021  PCP: Tiaja Hagan Boston, MD  REFERRING PROVIDER: Genia Harold, MD  END OF SESSION:   PT End of Session - 09/20/21 1150     Visit Number 3    Number of Visits 9    Date for PT Re-Evaluation 09/24/21    Authorization Type [employer]    PT Start Time 1146    PT Stop Time 1226    PT Time Calculation (min) 40 min    Activity Tolerance Patient tolerated treatment well    Behavior During Therapy [facility] for tasks assessed/performed              Past Medical History:  Diagnosis Date   Breast cancer ([place])    stage 0 breast cancer s/p lumpectomy   Headache    History of radiation therapy 07/27/10-09/09/10   right breast 61GYtotal/28f   Personal history of radiation therapy    2012   Past Surgical History:  Procedure Laterality Date   BREAST LUMPECTOMY Right 06/14/2010   right   CESAREAN SECTION     1999/2002   FINE NEEDLE ASPIRATION  09/14/2020   Procedure: FINE NEEDLE ASPIRATION (FNA) LINEAR;  Surgeon: IGarner Nash DO;  [facility];  Service: Pulmonary;;   LOBECTOMY Right    10/2020   VIDEO BRONCHOSCOPY WITH ENDOBRONCHIAL ULTRASOUND Right 09/14/2020   Procedure: VIDEO BRONCHOSCOPY WITH ENDOBRONCHIAL ULTRASOUND;  Surgeon: IGarner Nash DO;  [place]  Service: Pulmonary;  Laterality: Right;   Patient Active Problem List   Diagnosis Date Noted   History of benign carcinoid tumor 12/19/2020   Hilar mass 09/09/2020   Neck pain on left side 03/01/2015   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  Reported some soreness after but nothing major and played pickleball.   PERTINENT HISTORY:  56year old female with a history of breast cancer (s/p lumpectomy 2012), lung carcinoid  tumor (s/p right middle lobectomy), hypothyroidism who follows in clinic for right sided headaches which began in August 2022 following resection of a carcinoid tumor. 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 MRI 09/2020 with partially empty sella, but no papilledema on fundus exam. ESR/CRP were normal.   PAIN:  Pain number:  current 0/10, 1-2/10 Location:  Rt side of head/face, bilateral neck Description:  pressure, tightness, "hurting" Aggravating fx: maybe stress Easing fx:  PRECAUTIONS: History of cancer  OCCUPATION: Tree surgeon  PLOF: Independent  PATIENT GOALS Wants to get pressure and headaches to go away.   OBJECTIVE:   PATIENT SURVEYS:  08/25/2021 NDI 2/50 = 4%  Pt only scoring on the headache section.  COGNITION: 08/25/2021 Overall cognitive status: Within functional limits for tasks assessed  SENSATION: 08/25/2021 [facility] Pt reports some tingling down LLE that feels like sciatic pain.   POSTURE:  08/25/2021 No Significant postural limitations  PALPATION: 09/16/2021  Rt upper trap, Rt levator scap, rt and Lt infraspinatus trigger points throughout with increased tenderness  08/25/2021 Incr TTP with trigger points to R rhomboids, lower/mid traps, upper trap,  levator scapulae, R suboccipitals.  Hypomobility to central and R C7-T5, central and R C2/C3.     CERVICAL ROM:   Active ROM AROM (deg) 08/25/2021 AROM 09/16/2021 AROM 09/20/2021  Flexion 60 42   Extension 70  69   Right lateral  flexion 45    Left lateral flexion 45 ( feels more pulling on R side)    Right rotation 68 78 (feels pulling on Rt side) 80  Left rotation 60 (feels pulling on R side) 58 70   (Blank rows = not tested)  UPPER EXTREMITY ROM: 08/25/2021 [facility]   UPPER EXTREMITY MMT:  MMT Right 08/25/2021 Left 08/25/2021 Right 09/16/2021 Left 09/16/2021  Shoulder flexion   4/5 5/5  Shoulder extension      Shoulder abduction   5/5 5/5  Shoulder adduction      Shoulder extension      Shoulder internal rotation   5/5 5/5  Shoulder external rotation   5/5 5/5  Middle trapezius 3+/5 4+/5    Lower trapezius 3+/5 4/5    Elbow flexion      Elbow extension      Wrist flexion      Wrist extension      Wrist ulnar deviation      Wrist radial deviation      Wrist pronation      Wrist supination      Grip strength       (Blank rows = not tested)  08/25/2021 Bilateral UE MMTs in sitting 5/5     TODAY'S TREATMENT:  09/20/2021 Manual: Compression to bilateral upper trap increased pressure around occipital/temporal region of Rt side of head  Dry needling: twitch response c concordant symptoms Rt/Lt upper trap.  No adverse reaction.  Verbal consent given for today's treatment.   Therex: UBE UE only fwd/back 3 mins each way lvl 3.0 Tband ER c scapular retraction bilateral green band 2 x 10 Tband rows 2 x 10 green band Tband Gh ext 2 x 10 green band Standing at wall thoracic rotation/horizontal abduction green band x 10 bilateral    09/16/2021 Manual:  Compression to Rt upper trap increased pressure around occipital/temporal region of Rt side of head  Dry needling: twitch response c concordant symptoms Rt upper trap.  No adverse reaction.  Verbal consent given for today's treatment.  TherEx:  Seated scap retraction and shoulder ER x 10  Seated cervical retraction into therapist hand x 5  Review of existing HEP c cues for techniques.   Dry needling handout and cues given for after care.    Moist heat  Rt upper trap during there ex education/performance  PATIENT EDUCATION:  Education details: HEP progression, Dry needling Person educated: Patient Education method: Explanation, Demonstration, Verbal cues, and Handouts Education comprehension: verbalized understanding and returned demonstration   HOME EXERCISE PROGRAM: Access Code: [email or web address] URL: [email or web address] Date: 09/20/2021 Prepared by: Scot Jun  Exercises - Seated Levator Scapulae Stretch  - 2 x daily - 7 x weekly - 3 sets - 30s hold - Seated Upper Trapezius Stretch  - 2 x daily - 7 x weekly - 3 sets - 30s hold - Quadruped Full Range Thoracic Rotation with Reach  - 2 x daily - 7 x weekly - 1 sets - 10 reps - Shoulder External Rotation and Scapular Retraction with Resistance  - 3-5 x daily - 7 x weekly - 1-2 sets - 10 reps - Supine Cervical Retraction with Towel  - 2 x daily - 7 x weekly - 1 sets - 10 reps - 5 hold - Standing Shoulder Row with Anchored Resistance  - 1-2 x daily - 7 x weekly - 1-2 sets - 10-15 reps - Shoulder Extension with Resistance  - 1-2 x daily - 7 x weekly - 1-2 sets - 10-15 reps Standing thoracic rotation c horizontal abduction green band 1-2 sets of 10 1-2x/day  ASSESSMENT:  CLINICAL IMPRESSION: Positive improvement from treatment last visit with manual and dry needling. Presentation of tightness still noted in bilateral upper trap but improving.  Mild restriction in mid thoracic PAIVM movements.  Continued skilled PT services c HEP to help continue progression towards improvement.    OBJECTIVE IMPAIRMENTS decreased mobility, decreased ROM, decreased strength, hypomobility, increased fascial restrictions, and increased muscle spasms.   ACTIVITY LIMITATIONS N/A  PARTICIPATION LIMITATIONS: N/A   PERSONAL FACTORS Past/current experiences are also affecting patient's functional outcome.   REHAB POTENTIAL: Excellent  CLINICAL DECISION MAKING: Stable/uncomplicated  EVALUATION  COMPLEXITY: Low   GOALS: Goals reviewed with patient? Yes  SHORT TERM GOALS: ALL STGS= LTGS  LONG TERM GOALS: Target date: 09/22/2021  Pt will be independent with final HEP in order to build upon functional gains made in therapy. Baseline:  Goal status: on going - assessed 09/16/2021  2.  Pt will subjectively report at least a 75% improvement in headaches.  Baseline: Goal status: on going - assessed 09/16/2021  3.  Pt will improve Lt cervical rotation AROM to at least 70 degrees with no reports of pulling/tightness in order to demo improved rotation for daily living.  Baseline: 60 degrees, reports of pulling on Rt side.  Goal status: on going - assessed 09/16/2021   PLAN: PT FREQUENCY: 2x/week  PT DURATION: 4 weeks  PLANNED INTERVENTIONS: Therapeutic exercises, Therapeutic activity, Neuromuscular re-education, Patient/Family education, Self Care, Joint mobilization, Dry Needling, Spinal mobilization, Cryotherapy, Moist heat, Manual therapy, and Re-evaluation  PLAN FOR NEXT SESSION: Recert upon return, Dry needling as desired.    Scot Jun, PT, DPT, OCS, ATC 09/20/21  12:18 PM

## 2021-09-22 ENCOUNTER — Ambulatory Visit: Payer: Managed Care, Other (non HMO)

## 2021-09-26 ENCOUNTER — Encounter: Payer: Self-pay | Admitting: Rehabilitative and Restorative Service Providers"

## 2021-09-26 ENCOUNTER — Ambulatory Visit: Payer: Managed Care, Other (non HMO) | Admitting: Rehabilitative and Restorative Service Providers"

## 2021-09-26 ENCOUNTER — Ambulatory Visit: Payer: Managed Care, Other (non HMO) | Admitting: Psychiatry

## 2021-09-26 DIAGNOSIS — M542 Cervicalgia: Secondary | ICD-10-CM

## 2021-09-26 NOTE — Therapy (Signed)
OUTPATIENT PHYSICAL THERAPY TREATMENT NOTE /RECERT   Patient Name: [name] [name] MRN: [id number] DOB:1965/08/10, 56 y.o., female Today's Date: 09/26/2021  PCP: Nakyah Erdmann [place], MD  REFERRING PROVIDER: Genia Harold, MD  Progress Note Reporting Period 08/25/2021 to 09/26/2021  See note below for Objective Data and Assessment of Progress/Goals.      END OF SESSION:   PT End of Session - 09/26/21 1157     Visit Number 4    Number of Visits 9    Date for PT Re-Evaluation 10/24/21    Authorization Type [employer]    PT Start Time 1156    PT Stop Time 1220    PT Time Calculation (min) 24 min    Activity Tolerance Patient tolerated treatment well    Behavior During Therapy [facility] for tasks assessed/performed               Past Medical History:  Diagnosis Date   Breast cancer ([place])    stage 0 breast cancer s/p lumpectomy   Headache    History of radiation therapy 07/27/10-09/09/10   right breast 61GYtotal/37f   Personal history of radiation therapy    2012   Past Surgical History:  Procedure Laterality Date   BREAST LUMPECTOMY Right 06/14/2010   right   CESAREAN SECTION     1999/2002   FINE NEEDLE ASPIRATION  09/14/2020   Procedure: FINE NEEDLE ASPIRATION (FNA) LINEAR;  Surgeon: IGarner Nash DO;  [facility];  Service: Pulmonary;;   LOBECTOMY Right    10/2020   VIDEO BRONCHOSCOPY WITH ENDOBRONCHIAL ULTRASOUND Right 09/14/2020   Procedure: VIDEO BRONCHOSCOPY WITH ENDOBRONCHIAL ULTRASOUND;  Surgeon: IGarner Nash DO;  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  Pt indicated having no headache complaints.  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 MRI 09/2020 with partially empty sella, but no papilledema on fundus exam. ESR/CRP were normal.   PAIN:  Pain number:  current 0/10, 1-2/10 Location:  Rt side of head/face, bilateral neck Description:  pressure, tightness, "hurting" Aggravating fx: maybe stress Easing fx:  PRECAUTIONS: History of cancer  OCCUPATION: Tree surgeon  PLOF: Independent  PATIENT GOALS Wants to get pressure and headaches to go away.   OBJECTIVE:   PATIENT SURVEYS:  08/25/2021 NDI 2/50 = 4%  Pt only scoring on the headache section.  COGNITION: 08/25/2021 Overall cognitive status: Within functional limits for tasks assessed  SENSATION: 08/25/2021 [facility] Pt reports some tingling down LLE that feels like sciatic pain.   POSTURE:  08/25/2021 No Significant postural limitations  PALPATION: 09/16/2021  Rt upper trap, Rt levator scap, rt and Lt infraspinatus trigger points throughout with increased tenderness  08/25/2021 Incr TTP with trigger points to R rhomboids, lower/mid traps, upper trap,  levator scapulae, R suboccipitals.  Hypomobility to central and R C7-T5, central and R C2/C3.     CERVICAL ROM:   Active ROM AROM  (deg) 08/25/2021 AROM 09/16/2021 AROM 09/20/2021 AROM 09/26/2021  Flexion 60 42    Extension 70  69    Right lateral flexion 45     Left lateral flexion 45 ( feels more pulling on R side)     Right rotation 68 78 (feels pulling on Rt side) 80 WFL  Left rotation 60 (feels pulling on R side) 58 70 WFL   (Blank rows = not tested)  UPPER EXTREMITY ROM: 08/25/2021 [facility]   UPPER EXTREMITY MMT:  MMT Right 08/25/2021 Left 08/25/2021 Right 09/16/2021 Left 09/16/2021  Shoulder flexion   4/5 5/5  Shoulder extension      Shoulder abduction   5/5 5/5  Shoulder adduction      Shoulder extension      Shoulder internal rotation   5/5 5/5  Shoulder external rotation   5/5 5/5  Middle trapezius 3+/5 4+/5    Lower trapezius 3+/5 4/5    Elbow flexion      Elbow extension      Wrist flexion      Wrist extension      Wrist ulnar deviation      Wrist radial deviation      Wrist pronation      Wrist supination      Grip strength       (Blank rows = not tested)  08/25/2021 Bilateral UE MMTs in sitting 5/5     TODAY'S TREATMENT:  09/26/2021 Therex: Tband rows blue band 20x Tband rows gh ext 20x  Standing thoracic rotation c horizontal abduction at wall green band x 10 bilateral Machine rows 20 lbs x 15 Machine lat pull down 15 lbs x 15 Seated SA barrel hug at 110 deg flexion green band x 15  Review of existing HEP for home period trial.   09/20/2021 Manual: Compression to bilateral upper trap increased pressure around occipital/temporal region of Rt side of head  Dry needling: twitch response c concordant symptoms Rt/Lt upper trap.  No adverse reaction.  Verbal consent given for today's treatment.   Therex: UBE UE  only fwd/back 3 mins each way lvl 3.0 Tband ER c scapular retraction bilateral green band 2 x 10 Tband rows 2 x 10 green band Tband Gh ext 2 x 10 green band Standing at wall thoracic rotation/horizontal abduction green band x 10 bilateral    09/16/2021 Manual:  Compression to  Rt upper trap increased pressure around occipital/temporal region of Rt side of head  Dry needling: twitch response c concordant symptoms Rt upper trap.  No adverse reaction.  Verbal consent given for today's treatment.   TherEx:  Seated scap retraction and shoulder ER x 10  Seated cervical retraction into therapist hand x 5  Review of existing HEP c cues for techniques.   Dry needling handout and cues given for after care.    Moist heat Rt upper trap during there ex education/performance  PATIENT EDUCATION:  Education details: HEP update Person educated: Patient Education method: Explanation, Demonstration, Verbal cues, and Handouts Education comprehension: verbalized understanding and returned demonstration   HOME EXERCISE PROGRAM: Access Code: [email or web address] URL: [email or web address] Date: 09/26/2021 Prepared by: Scot Jun  Exercises - Seated Levator Scapulae Stretch  - 2 x daily - 7 x weekly - 3 sets - 30s hold - Seated Upper Trapezius Stretch  - 2 x daily - 7 x weekly - 3 sets - 30s hold - Quadruped Full Range Thoracic Rotation with Reach  - 2 x daily - 7 x weekly - 1 sets - 10 reps - Shoulder External Rotation and Scapular Retraction with Resistance  - 3-5 x daily - 7 x weekly - 1-2 sets - 10 reps - Supine Cervical Retraction with Towel  - 2 x daily - 7 x weekly - 1 sets - 10 reps - 5 hold - Standing Shoulder Row with Anchored Resistance  - 1-2 x daily - 7 x weekly - 1-2 sets - 10-15 reps - Shoulder Extension with Resistance  - 1-2 x daily - 7 x weekly - 1-2 sets - 10-15 reps - Seated 55 Gallon Barrel Hug with Resistance  - 1 x daily - 7 x weekly - 1 sets - 10-15 reps - 3-5 hold Standing thoracic rotation c horizontal abduction green band 1-2 sets of 10 1-2x/day  ASSESSMENT:  CLINICAL IMPRESSION: Pt has shown/reported great improvement of symptoms with last few visits.  At this time, recommend switch to trial HEP for self care management.  Pt was in  agreement with plan.    Good knowledge of HEP at this time.  Recert required to include today in treatment plan due to expiration of date of POC due to timeline required to switch clinics from evaluation.   POC extended 4 weeks to allow return prn based off symptom presentation.    OBJECTIVE IMPAIRMENTS decreased mobility, decreased ROM, decreased strength, hypomobility, increased fascial restrictions, and increased muscle spasms.   ACTIVITY LIMITATIONS N/A  PARTICIPATION LIMITATIONS: N/A   PERSONAL FACTORS Past/current experiences are also affecting patient's functional outcome.   REHAB POTENTIAL: Excellent  CLINICAL DECISION MAKING: Stable/uncomplicated  EVALUATION COMPLEXITY: Low   GOALS: Goals reviewed with patient? Yes  SHORT TERM GOALS: ALL STGS= LTGS  LONG TERM GOALS: Target date: 10/24/2021  Pt will be independent with final HEP in order to build upon functional gains made in therapy. Baseline:  Goal status: revised on 09/26/2021  2.  Pt will subjectively report at least a 75% improvement in headaches.  Baseline: Goal status: Met - 09/26/2021  3.  Pt will improve Lt cervical rotation AROM to at least 70  degrees with no reports of pulling/tightness in order to demo improved rotation for daily living.   Goal status: Met 09/26/2021  4.  Patient will demonstrate bilateral shoulder MMT 5/5 to facilitate usual lifting/carrying in daily life  Goal Status new - 09/26/2021   PLAN: PT FREQUENCY: 2x/week  PT DURATION: 4 weeks  PLANNED INTERVENTIONS: Therapeutic exercises, Therapeutic activity, Neuromuscular re-education, Patient/Family education, Self Care, Joint mobilization, Dry Needling, Spinal mobilization, Cryotherapy, Moist heat, Manual therapy, and Re-evaluation  PLAN FOR NEXT SESSION: Trial HEP.  Address symptoms if returning.    Scot Jun, PT, DPT, OCS, ATC 09/26/21  12:28 PM

## 2021-10-04 ENCOUNTER — Encounter: Payer: Self-pay | Admitting: Rehabilitative and Restorative Service Providers"

## 2021-10-04 ENCOUNTER — Ambulatory Visit: Payer: Managed Care, Other (non HMO) | Admitting: Rehabilitative and Restorative Service Providers"

## 2021-10-04 DIAGNOSIS — M542 Cervicalgia: Secondary | ICD-10-CM | POA: Diagnosis not present

## 2021-10-04 NOTE — Therapy (Addendum)
OUTPATIENT PHYSICAL THERAPY TREATMENT NOTE [facility]in   Patient Name: [name] [name] MRN: [id number] DOB:1965/07/26, 56 y.o., female Today's Date: 10/04/2021  PCP: Charlean Carneal [place], MD  REFERRING PROVIDER: Genia Harold, MD  Progress Note Reporting Period 08/25/2021 to 09/26/2021  See note below for Objective Data and Assessment of Progress/Goals.      END OF SESSION:   PT End of Session - 10/04/21 1151     Visit Number 5    Number of Visits 9    Date for PT Re-Evaluation 10/24/21    Authorization Type [employer]    PT Start Time 1147    PT Stop Time 1216    PT Time Calculation (min) 29 min    Activity Tolerance Patient tolerated treatment well    Behavior During Therapy [facility] for tasks assessed/performed                Past Medical History:  Diagnosis Date   Breast cancer ([place])    stage 0 breast cancer s/p lumpectomy   Headache    History of radiation therapy 07/27/10-09/09/10   right breast 61GYtotal/50f   Personal history of radiation therapy    2012   Past Surgical History:  Procedure Laterality Date   BREAST LUMPECTOMY Right 06/14/2010   right   CESAREAN SECTION     1999/2002   FINE NEEDLE ASPIRATION  09/14/2020   Procedure: FINE NEEDLE ASPIRATION (FNA) LINEAR;  Surgeon: IGarner Nash DO;  [facility];  Service: Pulmonary;;   LOBECTOMY Right    10/2020   VIDEO BRONCHOSCOPY WITH ENDOBRONCHIAL ULTRASOUND Right 09/14/2020   Procedure: VIDEO BRONCHOSCOPY WITH ENDOBRONCHIAL ULTRASOUND;  Surgeon: IGarner Nash DO;  [place] [place]  Service: Pulmonary;  Laterality: Right;   Patient Active Problem List   Diagnosis Date Noted   History of benign carcinoid tumor 12/19/2020   Hilar mass 09/09/2020   Neck pain on left side 03/01/2015   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 Pt indicated feeling pressure today related to Rt side of head.    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 MRI 09/2020 with partially empty sella, but no papilledema on fundus exam. ESR/CRP were normal.   PAIN:  Pain number:  current 0/10, pain up to 5/10 Location:  Rt side of head/face, bilateral neck Description:  pressure, tightness, "hurting" Aggravating fx: maybe stress Easing fx:  PRECAUTIONS: History of cancer  OCCUPATION: Tree surgeon  PLOF: Independent  PATIENT GOALS Wants to get pressure and headaches to go away.   OBJECTIVE:   PATIENT SURVEYS:  08/25/2021 NDI 2/50 = 4%  Pt only scoring on the headache section.  COGNITION: 08/25/2021 Overall cognitive status: Within functional limits for tasks assessed  SENSATION: 08/25/2021 [facility] Pt reports some tingling down LLE that feels like sciatic pain.   POSTURE:  08/25/2021 No Significant postural limitations  PALPATION: 09/16/2021  Rt upper trap, Rt levator scap, rt and Lt infraspinatus trigger points throughout with increased tenderness  08/25/2021 Incr TTP with trigger points to R rhomboids, lower/mid traps, upper trap,  levator  scapulae, R suboccipitals.  Hypomobility to central and R C7-T5, central and R C2/C3.     CERVICAL ROM:   Active ROM AROM (deg) 08/25/2021 AROM 09/16/2021 AROM 09/20/2021 AROM 09/26/2021 AROM 10/04/2021  Flexion 60 42     Extension 70  69     Right lateral flexion 45      Left lateral flexion 45 ( feels more pulling on R side)      Right rotation 68 78 (feels pulling on Rt side) 80 WFL 75 c pulling  Left rotation 60 (feels pulling on R side) 58 70 WFL 74 c pulling   (Blank rows = not tested)  UPPER EXTREMITY ROM: 08/25/2021 [facility]   UPPER EXTREMITY MMT:  MMT Right 08/25/2021 Left 08/25/2021 Right 09/16/2021 Left 09/16/2021  Shoulder flexion   4/5 5/5  Shoulder extension      Shoulder abduction   5/5 5/5  Shoulder adduction      Shoulder extension      Shoulder internal rotation   5/5 5/5  Shoulder external rotation   5/5 5/5  Middle trapezius 3+/5 4+/5    Lower trapezius 3+/5 4/5    Elbow flexion      Elbow extension      Wrist flexion      Wrist extension      Wrist ulnar deviation      Wrist radial deviation      Wrist pronation      Wrist supination      Grip strength       (Blank rows = not tested)  08/25/2021 Bilateral UE MMTs in sitting 5/5     TODAY'S TREATMENT:  10/04/2021 Manual: Compression to bilateral upper trap increased pressure around occipital/temporal region of Rt side of head  Dry needling: twitch response c concordant symptoms Rt/Lt upper trap.  No adverse reaction.  Verbal consent given for today's treatment.   Therex: UBE fwd/back 4 mins each lvl 3.0 Review of existing HEP for use to take care of symptoms.    09/26/2021 Therex: Tband rows blue band 20x Tband  rows gh ext 20x  Standing thoracic rotation c horizontal abduction at wall green band x 10 bilateral Machine rows 20 lbs x 15 Machine lat pull down 15 lbs x 15 Seated SA barrel hug at 110 deg flexion green band x 15  Review of existing HEP for home period trial.    09/20/2021 Manual: Compression to bilateral upper trap increased pressure around occipital/temporal region of Rt side of head  Dry needling: twitch response c concordant symptoms Rt/Lt upper trap.  No adverse reaction.  Verbal consent given for today's treatment.   Therex: UBE UE only fwd/back 3 mins each way lvl 3.0 Tband ER c scapular retraction bilateral green band 2 x 10 Tband rows 2 x 10 green band Tband Gh ext 2 x 10 green band Standing at wall thoracic rotation/horizontal abduction green band x 10 bilateral   PATIENT EDUCATION:  Education details: HEP update Person educated: Patient Education method: Explanation, Demonstration, Verbal cues, and Handouts Education comprehension: verbalized understanding and returned demonstration   HOME EXERCISE PROGRAM: Access Code: [email or web address] URL: [email or web address] Date: 09/26/2021 Prepared by: Scot Jun  Exercises - Seated Levator Scapulae Stretch  - 2 x daily - 7 x weekly - 3 sets - 30s hold - Seated Upper Trapezius Stretch  - 2 x daily - 7 x weekly - 3 sets - 30s hold - Quadruped Full Range Thoracic Rotation with Reach  - 2 x daily - 7 x weekly - 1 sets - 10 reps - Shoulder External Rotation and Scapular Retraction with Resistance  - 3-5 x daily - 7 x weekly - 1-2 sets - 10 reps - Supine Cervical Retraction with Towel  - 2 x daily - 7 x weekly - 1 sets - 10 reps - 5 hold - Standing Shoulder Row with Anchored Resistance  - 1-2 x daily - 7 x weekly - 1-2 sets - 10-15 reps - Shoulder Extension with Resistance  - 1-2 x daily - 7 x weekly - 1-2 sets - 10-15 reps - Seated 55 Gallon Barrel Hug with Resistance  - 1 x daily - 7 x weekly - 1 sets - 10-15 reps - 3-5 hold Standing thoracic rotation c horizontal abduction green band 1-2 sets of 10 1-2x/day  ASSESSMENT:  CLINICAL IMPRESSION: After review of symptom presentation, encouragement was given in use of HEP to help reduce symptoms in those times.  Performed  myofascial release techniques to help improve symptom recovery.  Mild tenderness and trigger points noted in area today.    OBJECTIVE IMPAIRMENTS decreased mobility, decreased ROM, decreased strength, hypomobility, increased fascial restrictions, and increased muscle spasms.   ACTIVITY LIMITATIONS N/A  PARTICIPATION LIMITATIONS: N/A   PERSONAL FACTORS Past/current experiences are also affecting patient's functional outcome.   REHAB POTENTIAL: Excellent  CLINICAL DECISION MAKING: Stable/uncomplicated  EVALUATION COMPLEXITY: Low   GOALS: Goals reviewed with patient? Yes  SHORT TERM GOALS: ALL STGS= LTGS  LONG TERM GOALS: Target date: 10/24/2021  Pt will be independent with final HEP in order to build upon functional gains made in therapy. Baseline:  Goal status: on going - assessed 10/04/2021  2.  Pt will subjectively report at least a 75% improvement in headaches.  Baseline: Goal status: Met - 09/26/2021  3.  Pt will improve Lt cervical rotation AROM to at least 70 degrees with no reports of pulling/tightness in order to demo improved rotation for daily living.   Goal status: Met 09/26/2021  4.  Patient will demonstrate bilateral shoulder MMT 5/5 to facilitate usual lifting/carrying  in daily life  Goal Status  on going - assessed 10/04/2021   PLAN: PT FREQUENCY: 2x/week  PT DURATION: 4 weeks  PLANNED INTERVENTIONS: Therapeutic exercises, Therapeutic activity, Neuromuscular re-education, Patient/Family education, Self Care, Joint mobilization, Dry Needling, Spinal mobilization, Cryotherapy, Moist heat, Manual therapy, and Re-evaluation  PLAN FOR NEXT SESSION:  Continue myofascial release as required.  Strengthening for UE.    Scot Jun, PT, DPT, OCS, ATC 10/04/21  12:22 PM    PHYSICAL THERAPY DISCHARGE SUMMARY  Visits from Start of Care: 5  Current functional level related to goals / functional outcomes: See note   Remaining deficits: See note    Education / Equipment: HEP  Patient goals were partially met. Patient is being discharged due to not returning since the last visit.  Scot Jun, PT, DPT, OCS, ATC 11/10/21  9:14 AM

## 2021-12-12 ENCOUNTER — Other Ambulatory Visit: Payer: Managed Care, Other (non HMO)

## 2022-01-11 ENCOUNTER — Ambulatory Visit
Admission: RE | Admit: 2022-01-11 | Discharge: 2022-01-11 | Disposition: A | Discharge status: Home / Self Care | Payer: Managed Care, Other (non HMO) | Source: Ambulatory Visit | Attending: Internal Medicine

## 2022-01-11 DIAGNOSIS — M8589 Other specified disorders of bone density and structure, multiple sites: Secondary | ICD-10-CM | POA: Diagnosis not present

## 2022-01-11 DIAGNOSIS — Z780 Asymptomatic menopausal state: Secondary | ICD-10-CM | POA: Diagnosis not present

## 2022-01-11 DIAGNOSIS — Z13820 Encounter for screening for osteoporosis: Secondary | ICD-10-CM

## 2022-02-10 ENCOUNTER — Other Ambulatory Visit: Payer: Self-pay | Admitting: Internal Medicine

## 2022-02-10 DIAGNOSIS — Z1231 Encounter for screening mammogram for malignant neoplasm of breast: Secondary | ICD-10-CM

## 2022-03-21 ENCOUNTER — Ambulatory Visit: Payer: BC Managed Care – PPO | Admitting: Psychiatry

## 2022-03-21 ENCOUNTER — Telehealth: Payer: Self-pay | Admitting: Psychiatry

## 2022-04-04 ENCOUNTER — Ambulatory Visit
Admission: RE | Admit: 2022-04-04 | Discharge: 2022-04-04 | Disposition: A | Discharge status: Home / Self Care | Payer: BC Managed Care – PPO | Source: Ambulatory Visit | Attending: Internal Medicine | Admitting: Internal Medicine

## 2022-04-04 DIAGNOSIS — Z1231 Encounter for screening mammogram for malignant neoplasm of breast: Secondary | ICD-10-CM

## 2022-04-12 NOTE — Progress Notes (Unsigned)
   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 MRI 09/2020 with partially empty sella, but no papilledema on fundus exam. ESR/CRP were normal.   At her last visit she was referred to neck PT. Was not interested in medications at that time.  Interval History: Headaches***neck pain***PT   Headache days per month: *** Migraine days per month*** Headache free days per month: ***  Current Headache Regimen: Preventative: *** Abortive: ***   Prior Therapies                                  Gabapentin 200 mg BID Indomethacin - side effects  Physical Exam:   Vital Signs: There were no vitals taken for this visit. GENERAL:  well appearing, in no acute distress, alert  SKIN:  Color, texture, turgor normal. No rashes or lesions HEAD:  Normocephalic/atraumatic. RESP: normal respiratory effort MSK:  No gross joint deformities.   NEUROLOGICAL: Mental Status: Alert, oriented to person, place and time, Follows commands, and Speech fluent and appropriate. Cranial Nerves: PERRL, face symmetric, no dysarthria, hearing grossly intact Motor: moves all extremities equally Gait: normal-based.  IMPRESSION: ***  PLAN: ***   Follow-up: ***  I spent a total of *** minutes on the date of the service. Headache education was done. Discussed lifestyle modification including increased oral hydration, decreased caffeine, exercise and stress management. Discussed treatment options including preventive and acute medications, natural supplements, and infusion therapy. Discussed medication overuse headache and to limit use of acute  treatments to no more than 2 days/week or 10 days/month. Discussed medication side effects, adverse reactions and drug interactions. Written educational materials and patient instructions outlining all of the above were given.  Genia Harold, MD

## 2022-04-13 ENCOUNTER — Ambulatory Visit: Payer: BC Managed Care – PPO | Admitting: Psychiatry

## 2022-04-13 ENCOUNTER — Encounter: Payer: Self-pay | Admitting: Psychiatry

## 2022-04-13 VITALS — BP 110/78 | HR 76 | Ht 64.0 in | Wt 125.0 lb

## 2022-04-13 DIAGNOSIS — R946 Abnormal results of thyroid function studies: Secondary | ICD-10-CM | POA: Diagnosis not present

## 2022-04-13 DIAGNOSIS — G44219 Episodic tension-type headache, not intractable: Secondary | ICD-10-CM | POA: Diagnosis not present

## 2022-04-13 DIAGNOSIS — D519 Vitamin B12 deficiency anemia, unspecified: Secondary | ICD-10-CM | POA: Diagnosis not present

## 2022-04-13 DIAGNOSIS — R202 Paresthesia of skin: Secondary | ICD-10-CM | POA: Diagnosis not present

## 2022-04-13 DIAGNOSIS — R799 Abnormal finding of blood chemistry, unspecified: Secondary | ICD-10-CM | POA: Diagnosis not present

## 2022-04-14 LAB — TSH: TSH: 2.59 u[IU]/mL (ref 0.450–4.500)

## 2022-04-14 LAB — VITAMIN B12: Vitamin B-12: 594 pg/mL (ref 232–1245)

## 2022-05-15 DIAGNOSIS — M722 Plantar fascial fibromatosis: Secondary | ICD-10-CM | POA: Diagnosis not present

## 2022-05-24 DIAGNOSIS — L821 Other seborrheic keratosis: Secondary | ICD-10-CM | POA: Diagnosis not present

## 2022-05-24 DIAGNOSIS — L570 Actinic keratosis: Secondary | ICD-10-CM | POA: Diagnosis not present

## 2022-05-25 DIAGNOSIS — Z08 Encounter for follow-up examination after completed treatment for malignant neoplasm: Secondary | ICD-10-CM | POA: Diagnosis not present

## 2022-05-25 DIAGNOSIS — C7A090 Malignant carcinoid tumor of the bronchus and lung: Secondary | ICD-10-CM | POA: Diagnosis not present

## 2022-05-25 DIAGNOSIS — Z85110 Personal history of malignant carcinoid tumor of bronchus and lung: Secondary | ICD-10-CM | POA: Diagnosis not present

## 2022-05-25 DIAGNOSIS — Z902 Acquired absence of lung [part of]: Secondary | ICD-10-CM | POA: Diagnosis not present

## 2022-08-25 DIAGNOSIS — M8589 Other specified disorders of bone density and structure, multiple sites: Secondary | ICD-10-CM | POA: Diagnosis not present

## 2022-08-25 DIAGNOSIS — R7989 Other specified abnormal findings of blood chemistry: Secondary | ICD-10-CM | POA: Diagnosis not present

## 2022-08-25 DIAGNOSIS — E039 Hypothyroidism, unspecified: Secondary | ICD-10-CM | POA: Diagnosis not present

## 2022-08-28 DIAGNOSIS — N644 Mastodynia: Secondary | ICD-10-CM | POA: Diagnosis not present

## 2022-08-28 DIAGNOSIS — Z01419 Encounter for gynecological examination (general) (routine) without abnormal findings: Secondary | ICD-10-CM | POA: Diagnosis not present

## 2022-08-28 DIAGNOSIS — E039 Hypothyroidism, unspecified: Secondary | ICD-10-CM | POA: Diagnosis not present

## 2022-08-28 DIAGNOSIS — Z853 Personal history of malignant neoplasm of breast: Secondary | ICD-10-CM | POA: Diagnosis not present

## 2022-08-28 DIAGNOSIS — R87615 Unsatisfactory cytologic smear of cervix: Secondary | ICD-10-CM | POA: Diagnosis not present

## 2022-09-01 DIAGNOSIS — Z0000 Encounter for general adult medical examination without abnormal findings: Secondary | ICD-10-CM | POA: Diagnosis not present

## 2022-09-01 DIAGNOSIS — M8589 Other specified disorders of bone density and structure, multiple sites: Secondary | ICD-10-CM | POA: Diagnosis not present

## 2022-09-01 DIAGNOSIS — Z1331 Encounter for screening for depression: Secondary | ICD-10-CM | POA: Diagnosis not present

## 2022-09-01 DIAGNOSIS — Z1339 Encounter for screening examination for other mental health and behavioral disorders: Secondary | ICD-10-CM | POA: Diagnosis not present

## 2022-10-03 DIAGNOSIS — L814 Other melanin hyperpigmentation: Secondary | ICD-10-CM | POA: Diagnosis not present

## 2022-10-03 DIAGNOSIS — L821 Other seborrheic keratosis: Secondary | ICD-10-CM | POA: Diagnosis not present

## 2022-10-03 DIAGNOSIS — L570 Actinic keratosis: Secondary | ICD-10-CM | POA: Diagnosis not present

## 2022-10-03 DIAGNOSIS — D2261 Melanocytic nevi of right upper limb, including shoulder: Secondary | ICD-10-CM | POA: Diagnosis not present

## 2022-10-03 DIAGNOSIS — D2262 Melanocytic nevi of left upper limb, including shoulder: Secondary | ICD-10-CM | POA: Diagnosis not present

## 2022-10-03 DIAGNOSIS — B078 Other viral warts: Secondary | ICD-10-CM | POA: Diagnosis not present

## 2022-10-09 DIAGNOSIS — L659 Nonscarring hair loss, unspecified: Secondary | ICD-10-CM | POA: Diagnosis not present

## 2022-10-09 DIAGNOSIS — E630 Essential fatty acid [EFA] deficiency: Secondary | ICD-10-CM | POA: Diagnosis not present

## 2022-10-09 DIAGNOSIS — R5383 Other fatigue: Secondary | ICD-10-CM | POA: Diagnosis not present

## 2022-10-09 DIAGNOSIS — E039 Hypothyroidism, unspecified: Secondary | ICD-10-CM | POA: Diagnosis not present

## 2022-12-21 DIAGNOSIS — H43813 Vitreous degeneration, bilateral: Secondary | ICD-10-CM | POA: Diagnosis not present

## 2023-01-12 DIAGNOSIS — M25562 Pain in left knee: Secondary | ICD-10-CM | POA: Diagnosis not present

## 2023-01-26 DIAGNOSIS — E039 Hypothyroidism, unspecified: Secondary | ICD-10-CM | POA: Diagnosis not present

## 2023-02-01 DIAGNOSIS — M25562 Pain in left knee: Secondary | ICD-10-CM | POA: Diagnosis not present

## 2023-02-13 DIAGNOSIS — S83282S Other tear of lateral meniscus, current injury, left knee, sequela: Secondary | ICD-10-CM | POA: Diagnosis not present

## 2023-02-13 DIAGNOSIS — M25562 Pain in left knee: Secondary | ICD-10-CM | POA: Diagnosis not present

## 2023-03-07 ENCOUNTER — Other Ambulatory Visit: Payer: Self-pay | Admitting: Internal Medicine

## 2023-03-07 DIAGNOSIS — Z0000 Encounter for general adult medical examination without abnormal findings: Secondary | ICD-10-CM

## 2023-03-12 DIAGNOSIS — M65962 Unspecified synovitis and tenosynovitis, left lower leg: Secondary | ICD-10-CM | POA: Diagnosis not present

## 2023-03-12 DIAGNOSIS — M6752 Plica syndrome, left knee: Secondary | ICD-10-CM | POA: Diagnosis not present

## 2023-03-12 DIAGNOSIS — G8918 Other acute postprocedural pain: Secondary | ICD-10-CM | POA: Diagnosis not present

## 2023-03-12 DIAGNOSIS — Y999 Unspecified external cause status: Secondary | ICD-10-CM | POA: Diagnosis not present

## 2023-03-12 DIAGNOSIS — S83272A Complex tear of lateral meniscus, current injury, left knee, initial encounter: Secondary | ICD-10-CM | POA: Diagnosis not present

## 2023-03-12 DIAGNOSIS — X58XXXA Exposure to other specified factors, initial encounter: Secondary | ICD-10-CM | POA: Diagnosis not present

## 2023-03-12 DIAGNOSIS — M794 Hypertrophy of (infrapatellar) fat pad: Secondary | ICD-10-CM | POA: Diagnosis not present

## 2023-03-12 DIAGNOSIS — S83282A Other tear of lateral meniscus, current injury, left knee, initial encounter: Secondary | ICD-10-CM | POA: Diagnosis not present

## 2023-03-19 ENCOUNTER — Other Ambulatory Visit: Payer: Self-pay | Admitting: Internal Medicine

## 2023-03-19 DIAGNOSIS — Z0000 Encounter for general adult medical examination without abnormal findings: Secondary | ICD-10-CM

## 2023-03-19 DIAGNOSIS — N644 Mastodynia: Secondary | ICD-10-CM

## 2023-03-19 DIAGNOSIS — M25562 Pain in left knee: Secondary | ICD-10-CM | POA: Diagnosis not present

## 2023-03-27 DIAGNOSIS — M25562 Pain in left knee: Secondary | ICD-10-CM | POA: Diagnosis not present

## 2023-03-29 ENCOUNTER — Other Ambulatory Visit: Payer: Self-pay | Admitting: Internal Medicine

## 2023-03-29 DIAGNOSIS — M25562 Pain in left knee: Secondary | ICD-10-CM | POA: Diagnosis not present

## 2023-03-29 DIAGNOSIS — N644 Mastodynia: Secondary | ICD-10-CM

## 2023-04-03 ENCOUNTER — Ambulatory Visit
Admission: RE | Admit: 2023-04-03 | Discharge: 2023-04-03 | Disposition: A | Discharge status: Home / Self Care | Source: Ambulatory Visit | Attending: Internal Medicine

## 2023-04-03 DIAGNOSIS — Z853 Personal history of malignant neoplasm of breast: Secondary | ICD-10-CM | POA: Diagnosis not present

## 2023-04-03 DIAGNOSIS — N644 Mastodynia: Secondary | ICD-10-CM

## 2023-04-03 DIAGNOSIS — L821 Other seborrheic keratosis: Secondary | ICD-10-CM | POA: Diagnosis not present

## 2023-04-03 DIAGNOSIS — L570 Actinic keratosis: Secondary | ICD-10-CM | POA: Diagnosis not present

## 2023-04-03 DIAGNOSIS — M25562 Pain in left knee: Secondary | ICD-10-CM | POA: Diagnosis not present

## 2023-04-03 DIAGNOSIS — R928 Other abnormal and inconclusive findings on diagnostic imaging of breast: Secondary | ICD-10-CM | POA: Diagnosis not present

## 2023-04-05 ENCOUNTER — Other Ambulatory Visit: Payer: Self-pay | Admitting: Internal Medicine

## 2023-04-05 DIAGNOSIS — N6489 Other specified disorders of breast: Secondary | ICD-10-CM

## 2023-04-06 DIAGNOSIS — M25562 Pain in left knee: Secondary | ICD-10-CM | POA: Diagnosis not present

## 2023-04-11 DIAGNOSIS — M25562 Pain in left knee: Secondary | ICD-10-CM | POA: Diagnosis not present

## 2023-04-13 DIAGNOSIS — E039 Hypothyroidism, unspecified: Secondary | ICD-10-CM | POA: Diagnosis not present

## 2023-04-13 DIAGNOSIS — R5383 Other fatigue: Secondary | ICD-10-CM | POA: Diagnosis not present

## 2023-04-13 DIAGNOSIS — E630 Essential fatty acid [EFA] deficiency: Secondary | ICD-10-CM | POA: Diagnosis not present

## 2023-04-24 DIAGNOSIS — M25562 Pain in left knee: Secondary | ICD-10-CM | POA: Diagnosis not present

## 2023-04-26 DIAGNOSIS — M25562 Pain in left knee: Secondary | ICD-10-CM | POA: Diagnosis not present

## 2023-05-24 DIAGNOSIS — D3A090 Benign carcinoid tumor of the bronchus and lung: Secondary | ICD-10-CM | POA: Diagnosis not present

## 2023-05-24 DIAGNOSIS — D1431 Benign neoplasm of right bronchus and lung: Secondary | ICD-10-CM | POA: Diagnosis not present

## 2023-05-24 DIAGNOSIS — C7A090 Malignant carcinoid tumor of the bronchus and lung: Secondary | ICD-10-CM | POA: Diagnosis not present

## 2023-09-06 DIAGNOSIS — D72819 Decreased white blood cell count, unspecified: Secondary | ICD-10-CM | POA: Diagnosis not present

## 2023-09-06 DIAGNOSIS — Z01419 Encounter for gynecological examination (general) (routine) without abnormal findings: Secondary | ICD-10-CM | POA: Diagnosis not present

## 2023-09-06 DIAGNOSIS — E039 Hypothyroidism, unspecified: Secondary | ICD-10-CM | POA: Diagnosis not present

## 2023-09-06 DIAGNOSIS — M8589 Other specified disorders of bone density and structure, multiple sites: Secondary | ICD-10-CM | POA: Diagnosis not present

## 2023-09-13 DIAGNOSIS — Z1339 Encounter for screening examination for other mental health and behavioral disorders: Secondary | ICD-10-CM | POA: Diagnosis not present

## 2023-09-13 DIAGNOSIS — Z1331 Encounter for screening for depression: Secondary | ICD-10-CM | POA: Diagnosis not present

## 2023-09-13 DIAGNOSIS — M8589 Other specified disorders of bone density and structure, multiple sites: Secondary | ICD-10-CM | POA: Diagnosis not present

## 2023-09-13 DIAGNOSIS — Z0000 Encounter for general adult medical examination without abnormal findings: Secondary | ICD-10-CM | POA: Diagnosis not present

## 2023-10-05 ENCOUNTER — Ambulatory Visit
Admission: RE | Admit: 2023-10-05 | Discharge: 2023-10-05 | Disposition: A | Discharge status: Home / Self Care | Source: Ambulatory Visit | Attending: Internal Medicine | Admitting: Internal Medicine

## 2023-10-05 DIAGNOSIS — N6489 Other specified disorders of breast: Secondary | ICD-10-CM

## 2023-10-09 ENCOUNTER — Other Ambulatory Visit: Payer: Self-pay | Admitting: Internal Medicine

## 2023-10-09 DIAGNOSIS — N6489 Other specified disorders of breast: Secondary | ICD-10-CM

## 2023-10-23 DIAGNOSIS — K8681 Exocrine pancreatic insufficiency: Secondary | ICD-10-CM | POA: Diagnosis not present

## 2023-10-23 DIAGNOSIS — E630 Essential fatty acid [EFA] deficiency: Secondary | ICD-10-CM | POA: Diagnosis not present

## 2023-10-23 DIAGNOSIS — R9230 Dense breasts, unspecified: Secondary | ICD-10-CM | POA: Diagnosis not present

## 2023-10-23 DIAGNOSIS — R5383 Other fatigue: Secondary | ICD-10-CM | POA: Diagnosis not present

## 2023-10-23 DIAGNOSIS — E039 Hypothyroidism, unspecified: Secondary | ICD-10-CM | POA: Diagnosis not present

## 2023-10-23 DIAGNOSIS — R946 Abnormal results of thyroid function studies: Secondary | ICD-10-CM | POA: Diagnosis not present

## 2023-12-04 DIAGNOSIS — Z9889 Other specified postprocedural states: Secondary | ICD-10-CM | POA: Diagnosis not present

## 2023-12-04 DIAGNOSIS — M1712 Unilateral primary osteoarthritis, left knee: Secondary | ICD-10-CM | POA: Diagnosis not present

## 2023-12-10 DIAGNOSIS — L821 Other seborrheic keratosis: Secondary | ICD-10-CM | POA: Diagnosis not present

## 2023-12-10 DIAGNOSIS — L570 Actinic keratosis: Secondary | ICD-10-CM | POA: Diagnosis not present

## 2023-12-24 DIAGNOSIS — H43813 Vitreous degeneration, bilateral: Secondary | ICD-10-CM | POA: Diagnosis not present

## 2024-04-07 ENCOUNTER — Encounter
